# Patient Record
Sex: Male | Born: 1965 | Race: Black or African American | Hispanic: No | Marital: Single | Smoking: Former smoker
Health system: Southern US, Community
[De-identification: ages and names within clinical notes are randomized; demographics above are authoritative.]

## PROBLEM LIST (undated history)

## (undated) DIAGNOSIS — E785 Hyperlipidemia, unspecified: Secondary | ICD-10-CM

## (undated) DIAGNOSIS — R590 Localized enlarged lymph nodes: Secondary | ICD-10-CM

## (undated) DIAGNOSIS — J45909 Unspecified asthma, uncomplicated: Secondary | ICD-10-CM

## (undated) DIAGNOSIS — T7840XA Allergy, unspecified, initial encounter: Secondary | ICD-10-CM

## (undated) DIAGNOSIS — I10 Essential (primary) hypertension: Secondary | ICD-10-CM

## (undated) DIAGNOSIS — I2510 Atherosclerotic heart disease of native coronary artery without angina pectoris: Secondary | ICD-10-CM

## (undated) DIAGNOSIS — I509 Heart failure, unspecified: Secondary | ICD-10-CM

## (undated) HISTORY — DX: Allergy, unspecified, initial encounter: T78.40XA

## (undated) HISTORY — PX: NO PAST SURGERIES: SHX2092

## (undated) HISTORY — DX: Unspecified asthma, uncomplicated: J45.909

---

## 1998-07-16 ENCOUNTER — Emergency Department (HOSPITAL_COMMUNITY): Admission: EM | Admit: 1998-07-16 | Discharge: 1998-07-16 | Payer: Self-pay | Admitting: Emergency Medicine

## 1998-08-21 ENCOUNTER — Emergency Department (HOSPITAL_COMMUNITY): Admission: EM | Admit: 1998-08-21 | Discharge: 1998-08-21 | Payer: Self-pay | Admitting: Emergency Medicine

## 2014-12-30 ENCOUNTER — Ambulatory Visit (INDEPENDENT_AMBULATORY_CARE_PROVIDER_SITE_OTHER): Payer: Self-pay | Admitting: Physician Assistant

## 2014-12-30 VITALS — BP 160/92 | HR 68 | Temp 97.7°F | Resp 16 | Ht 74.0 in | Wt 186.6 lb

## 2014-12-30 DIAGNOSIS — L309 Dermatitis, unspecified: Secondary | ICD-10-CM

## 2014-12-30 DIAGNOSIS — IMO0001 Reserved for inherently not codable concepts without codable children: Secondary | ICD-10-CM

## 2014-12-30 DIAGNOSIS — R030 Elevated blood-pressure reading, without diagnosis of hypertension: Secondary | ICD-10-CM

## 2014-12-30 MED ORDER — TRIAMCINOLONE ACETONIDE 0.1 % EX OINT: 1.0000 "application " | TOPICAL_OINTMENT | Freq: Two times a day (BID) | CUTANEOUS | Status: DC

## 2014-12-30 MED ORDER — PREDNISONE 20 MG PO TABS: ORAL_TABLET | ORAL | Status: DC

## 2014-12-30 NOTE — Progress Notes (Signed)
   01/05/2015 at 9:10 PM  [name] / DOB: [birth year] / MRN: [id number]  The patient  does not have a problem list on file.  SUBJECTIVE  Christian Horton is a 49 y.o. homeless male with a history of eczema presenting for the chief complaint of itching a skin irritation. States he was at a fish fry 3 days ago and noticed his skin was aggravated after.  Since that time he has developed skin irritation throughout his entire body, worse on his legs and ankles. Reports episodes worse than this in the past, and typically gets a cream and pills and this alleviates his symptoms.  He has been using a dermatologist recommended lotion for his symptoms.   He denies a history of HTN.  Denies HA, nausea, vision changes, chest pain, palpitations, and SOB.     He  has a past medical history of Allergy and Asthma.    Medications reviewed and updated by myself where necessary, and exist elsewhere in the encounter.   [name] is allergic to penicillins. He  reports that he has quit smoking. He does not have any smokeless tobacco history on file. He  has no sexual activity history on file. The patient  has no past surgical history on file.  His family history includes Asthma in his mother and sister; Cancer in his father; Stroke in his father.  Review of Systems  Constitutional: Negative for fever and chills.  Respiratory: Negative for cough.   Gastrointestinal: Negative for nausea.  Skin: Positive for itching and rash.  Neurological: Negative for dizziness and headaches.    OBJECTIVE  His  height is 6\' 2"  (1.88 m) and weight is 186 lb 9.6 oz (84.641 kg). His oral temperature is 97.7 F (36.5 C). His blood pressure is 160/92 and his pulse is 68. His respiration is 16 and oxygen saturation is 98%.  The patient's body mass index is 23.95 kg/(m^2).  Physical Exam  Vitals reviewed. Constitutional: He is oriented to person, place, and time. He appears well-developed and well-nourished. No distress.   Cardiovascular: Normal rate and regular rhythm.   Respiratory: Effort normal.  Neurological: He is alert and oriented to person, place, and time.  Skin: Rash noted. He is not diaphoretic.  Generalized xerotic rash.  The ankles exhibit scaling and mild swelling and excoriation without tenderness. There is no exudate.        No results found for this or any previous visit (from the past 24 hour(s)).  ASSESSMENT & PLAN  Linkoln was seen today for eczema and allergic reaction.  Diagnoses and all orders for this visit:  Eczema -     triamcinolone ointment (KENALOG) 0.1 %; Apply 1 application topically 2 (two) times daily. -     predniSONE (DELTASONE) 20 MG tablet; Take 3 PO QAM x3days, 2 PO QAM x3days, 1 PO QAM x3days -     Ambulatory referral to Dermatology  Elevated BP:: Patient without history of HTN. He is amenable to measuring and recording his BP daily and will return the measures to me for review in one week.      The patient was advised to call or come back to clinic if he does not see an improvement in symptoms, or worsens with the above plan.   Deliah Boston, MHS, PA-C [facility] 01/05/2015 9:10 PM

## 2015-01-07 NOTE — Progress Notes (Signed)
  Medical screening examination/treatment/procedure(s) were performed by non-physician practitioner and as supervising physician I was immediately available for consultation/collaboration.     

## 2015-01-21 ENCOUNTER — Other Ambulatory Visit: Payer: Self-pay | Admitting: Physician Assistant

## 2015-01-21 ENCOUNTER — Ambulatory Visit (INDEPENDENT_AMBULATORY_CARE_PROVIDER_SITE_OTHER): Payer: BLUE CROSS/BLUE SHIELD | Admitting: Family Medicine

## 2015-01-21 VITALS — BP 170/90 | HR 67 | Temp 98.4°F | Resp 16 | Ht 74.0 in | Wt 185.0 lb

## 2015-01-21 DIAGNOSIS — I517 Cardiomegaly: Secondary | ICD-10-CM

## 2015-01-21 DIAGNOSIS — R0789 Other chest pain: Secondary | ICD-10-CM

## 2015-01-21 DIAGNOSIS — I10 Essential (primary) hypertension: Secondary | ICD-10-CM

## 2015-01-21 DIAGNOSIS — D539 Nutritional anemia, unspecified: Secondary | ICD-10-CM | POA: Diagnosis not present

## 2015-01-21 LAB — POCT CBC
Granulocyte percent: 51.9 %G (ref 37–80)
HCT, POC: 42.1 % — AB (ref 43.5–53.7)
Hemoglobin: 13.6 g/dL — AB (ref 14.1–18.1)
Lymph, poc: 1.5 (ref 0.6–3.4)
MCH, POC: 25.2 pg — AB (ref 27–31.2)
MCHC: 32.4 g/dL (ref 31.8–35.4)
MCV: 77.8 fL — AB (ref 80–97)
MID (CBC): 0.4 (ref 0–0.9)
MPV: 7.5 fL (ref 0–99.8)
PLATELET COUNT, POC: 170 10*3/uL (ref 142–424)
POC Granulocyte: 2.1 (ref 2–6.9)
POC LYMPH PERCENT: 37.9 %L (ref 10–50)
POC MID %: 10.2 %M (ref 0–12)
RBC: 5.41 M/uL (ref 4.69–6.13)
RDW, POC: 14.9 %
WBC: 4 10*3/uL — AB (ref 4.6–10.2)

## 2015-01-21 LAB — POCT URINALYSIS DIPSTICK
Bilirubin, UA: NEGATIVE
Glucose, UA: NEGATIVE
Ketones, UA: NEGATIVE
Leukocytes, UA: NEGATIVE
Nitrite, UA: NEGATIVE
PH UA: 7
PROTEIN UA: NEGATIVE
RBC UA: NEGATIVE
SPEC GRAV UA: 1.02
UROBILINOGEN UA: 1

## 2015-01-21 LAB — POCT GLYCOSYLATED HEMOGLOBIN (HGB A1C): Hemoglobin A1C: 5.7

## 2015-01-21 MED ORDER — LOSARTAN POTASSIUM-HCTZ 100-12.5 MG PO TABS: 1.0000 | ORAL_TABLET | Freq: Every day | ORAL | Status: DC

## 2015-01-21 NOTE — Progress Notes (Signed)
01/21/2015 at 7:08 PM  Christian Horton / DOB: February 25, 1966 / MRN: [id number]  The patient  does not have a problem list on file.  SUBJECTIVE  Christian Horton is a 49 y.o. well appearing homeless male with a history of eczema, asthma, and ASA allergy presenting for the chief complaint of blood pressure check. He denies HA, nausea, orthopnea, leg and abdominal swelling, DOE and SOB. He has never been on medication for HTN.    He reports repeated episodes of chest pain over the last 2 years which he describes as sharp, left substernal and these episodes last anywhere from 5 to 10 minutes. The pain is made worse by intense physical labor.  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  His most recent episode occurred last week during work and denies any SOB, chest pressure, presyncope and diaphoresis.  He denies leg and abdominal swelling.   He does not want to work up this problem.     He  has a past medical history of Allergy and Asthma.    Medications reviewed and updated by myself where necessary, and exist elsewhere in the encounter.   Christian Horton is allergic to penicillins. He  reports that he has quit smoking. He does not have any smokeless tobacco history on file. He  has no sexual activity history on file. The patient  has no past surgical history on file.  His family history includes Asthma in his mother and sister; Cancer in his father; Stroke in his father.  Review of Systems  Constitutional: Negative for fever and chills.  Respiratory: Negative for shortness of breath.   Cardiovascular: Negative for chest pain.  Gastrointestinal: Negative for nausea and abdominal pain.  Genitourinary: Negative.   Skin: Negative for rash.  Neurological: Negative for dizziness and headaches.    OBJECTIVE  His  height is  (1.88 m) and weight is 185 lb (83.915 kg). His oral temperature is 98.4 F (36.9 C). His blood pressure is 170/90 and his  pulse is 67. His respiration is 16 and oxygen saturation is 98%.  The patient's body mass index is 23.74 kg/(m^2).  Physical Exam  Vitals reviewed. Constitutional: He is oriented to person, place, and time. He appears well-developed. No distress.  Eyes: EOM are normal. Pupils are equal, round, and reactive to light. No scleral icterus.  Neck: Normal range of motion.  Cardiovascular: Regular rhythm, normal heart sounds and intact distal pulses.  Exam reveals no gallop and no friction rub.   No murmur heard. Respiratory: Effort normal and breath sounds normal.  GI: Soft. He exhibits no distension.  Musculoskeletal: Normal range of motion.  Neurological: He is alert and oriented to person, place, and time. No cranial nerve deficit.  Skin: Skin is warm and dry. No rash noted. He is not diaphoretic.  Psychiatric: He has a normal mood and affect.   EKG: Sinus brady with mild left axis deviation and LVH.   Results for orders placed or performed in visit on 01/21/15 (from the past 24 hour(s))  POCT CBC     Status: Abnormal   Collection Time: 01/21/15  6:53 PM  Result Value Ref Range   WBC 4.0 (A) 4.6 - 10.2 K/uL   Lymph, poc 1.5 0.6 - 3.4   POC LYMPH PERCENT 37.9 10 - 50 %L   MID (cbc) 0.4 0 - 0.9   POC MID % 10.2 0 - 12 %M  POC Granulocyte 2.1 2 - 6.9   Granulocyte percent 51.9 37 - 80 %G   RBC 5.41 4.69 - 6.13 M/uL   Hemoglobin 13.6 (A) 14.1 - 18.1 g/dL   HCT, POC 94.4 (A) 96.7 - 53.7 %   MCV 77.8 (A) 80 - 97 fL   MCH, POC 25.2 (A) 27 - 31.2 pg   MCHC 32.4 31.8 - 35.4 g/dL   RDW, POC 59.1 %   Platelet Count, POC 170 142 - 424 K/uL   MPV 7.5 0 - 99.8 fL  POCT glycosylated hemoglobin (Hb A1C)     Status: None   Collection Time: 01/21/15  7:00 PM  Result Value Ref Range   Hemoglobin A1C 5.7     ASSESSMENT & PLAN  Christian Horton was seen today for hypertension.  Diagnoses and all orders for this visit:  Uncontrolled stage 2 hypertension: Patient with most likely chronic untreated  HTN and now with LVH.  Will treat with duel agent Losartan/HCTZ.  He is to RTC in 3 weeks for follow up.  Hopefully his cardiology evaluation will have taken place by that time.  He is not a diabetic and is not a smoker.  At next visit will check HIV, RPR and lipids in order to get him caught up with his screenings.  He is due for a colonoscopy.  -     EKG 12-Lead -     COMPLETE METABOLIC PANEL WITH GFR -     TSH -     POCT CBC -     Cancel: Hemoglobin A1c -     POCT glycosylated hemoglobin (Hb A1C)  Macrocytic anemia -     Iron  Left ventricular hypertrophy by electrocardiogram -     Ambulatory referral to Cardiology  Atypical chest pain -     Ambulatory referral to Cardiology    The patient was advised to call or come back to clinic if he does not see an improvement in symptoms, or worsens with the above plan.   Deliah Boston, MHS, PA-C [facility] 01/21/2015 7:08 PM

## 2015-01-22 LAB — COMPLETE METABOLIC PANEL WITH GFR
ALBUMIN: 3.6 g/dL (ref 3.6–5.1)
ALK PHOS: 53 U/L (ref 40–115)
ALT: 23 U/L (ref 9–46)
AST: 27 U/L (ref 10–35)
BUN: 18 mg/dL (ref 7–25)
CHLORIDE: 104 mmol/L (ref 98–110)
CO2: 26 mmol/L (ref 20–31)
Calcium: 9.2 mg/dL (ref 8.6–10.3)
Creat: 0.92 mg/dL (ref 0.70–1.33)
GFR, Est African American: >89 mL/min (ref >=60)
GLUCOSE: 85 mg/dL (ref 65–99)
POTASSIUM: 3.9 mmol/L (ref 3.5–5.3)
Sodium: 139 mmol/L (ref 135–146)
Total Bilirubin: 0.4 mg/dL (ref 0.2–1.2)
Total Protein: 6.4 g/dL (ref 6.1–8.1)

## 2015-01-22 LAB — TSH: TSH: 1.454 u[IU]/mL (ref 0.350–4.500)

## 2015-01-22 LAB — IRON: Iron: 67 ug/dL (ref 50–180)

## 2015-01-24 NOTE — Progress Notes (Signed)
History and physical examinations reviewed in detail with PA Clark.  EKG reviewed during visit. Agree with assessment and plan. Laconya Clere Paulita Fujita, M.D. [facility] [address] [place], [place]  [address] [phone] phone [phone] fax

## 2015-01-26 ENCOUNTER — Telehealth: Payer: Self-pay | Admitting: Family Medicine

## 2015-01-26 LAB — IRON AND TIBC
%SAT: 21 % (ref 15–60)
Iron: 65 ug/dL (ref 50–180)
TIBC: 308 ug/dL (ref 250–425)
UIBC: 243 ug/dL (ref 125–400)

## 2015-01-26 LAB — FERRITIN: Ferritin: 27 ng/mL (ref 22–322)

## 2015-01-26 NOTE — Telephone Encounter (Signed)
Noted.  Deliah Boston, MS, PA-C   3:22 PM, 01/26/2015

## 2015-01-26 NOTE — Telephone Encounter (Signed)
[facility] states that they couldn't do the reticulocytes test cause it has to be within 48 hours

## 2015-02-18 ENCOUNTER — Ambulatory Visit (INDEPENDENT_AMBULATORY_CARE_PROVIDER_SITE_OTHER): Payer: BLUE CROSS/BLUE SHIELD | Admitting: Cardiology

## 2015-02-18 ENCOUNTER — Encounter: Payer: Self-pay | Admitting: Cardiology

## 2015-02-18 VITALS — BP 134/86 | HR 96 | Ht 74.0 in | Wt 185.0 lb

## 2015-02-18 DIAGNOSIS — I10 Essential (primary) hypertension: Secondary | ICD-10-CM

## 2015-02-18 DIAGNOSIS — R0789 Other chest pain: Secondary | ICD-10-CM

## 2015-02-18 DIAGNOSIS — R9431 Abnormal electrocardiogram [ECG] [EKG]: Secondary | ICD-10-CM | POA: Diagnosis not present

## 2015-02-18 HISTORY — DX: Essential (primary) hypertension: I10

## 2015-02-18 NOTE — Patient Instructions (Signed)
Medication Instructions:  The current medical regimen is effective;  continue present plan and medications.  Testing/Procedures: Your physician has requested that you have an exercise tolerance test. For further information please visit [email or web address] Please also follow instruction sheet, as given.  Follow-Up: Follow up as needed.  Thank you for choosing [facility]!!

## 2015-02-18 NOTE — Progress Notes (Signed)
Cardiology Office Note   Date:  02/18/2015   ID:  Christian Horton, DOB Jan 22, 1966, MRN [id number]  PCP:  Charolett Bumpers, MD  Cardiologist:   Donato Schultz, MD   No chief complaint on file.     History of Present Illness: Christian Horton is a 49 y.o. male who presents for evaluation of abnormal EKG with left ventricular hypertrophy echocardiogram as well as atypical chest pain. EKG personally reviewed from 01/21/15 shows sinus bradycardia left axis deviation with nonspecific ST-T wave changes.  He had an episode of chest discomfort over the last several years described as left-sided substernal a few minutes duration made worse by exertion, labor. Described it as a sharp type of discomfort. Yesterday he had one and he thought he might be having a heart attack. Quit smoking 18 years ago. No early heart disease. Has an aspirin allergy, skin was very itchy he states. Blood pressure has been elevated. This is much improved now with current medication.  Sometimes he has asthma. This type of chest discomfort has been present for over 5 years.      Past Medical History  Diagnosis Date  . Allergy   . Asthma     Past Surgical History  Procedure Laterality Date  . No past surgeries       Current Outpatient Prescriptions  Medication Sig Dispense Refill  . ALBUTEROL IN Inhale into the lungs.    Marland Kitchen losartan-hydrochlorothiazide (HYZAAR) 100-12.5 MG per tablet Take 1 tablet by mouth daily. 30 tablet 3  . triamcinolone ointment (KENALOG) 0.1 % Apply 1 application topically 2 (two) times daily. 30 g 5   No current facility-administered medications for this visit.    Allergies:   Penicillins    Social History:  The patient  reports that he has quit smoking. He does not have any smokeless tobacco history on file.   Family History:  The patient's family history includes Asthma in his mother and sister; Cancer in his father; Stroke in his father.    ROS:  Please see the history of  present illness.   Otherwise, review of systems are positive for snoring, chest pain.   All other systems are reviewed and negative.    PHYSICAL EXAM: VS:  BP 134/86 mmHg  Pulse 96  Ht  (1.88 m)  Wt 185 lb (83.915 kg)  BMI 23.74 kg/m2 , BMI Body mass index is 23.74 kg/(m^2). GEN: Well nourished, well developed, in no acute distress HEENT: normal Neck: no JVD, carotid bruits, or masses Cardiac: RRR; no murmurs, rubs, or gallops,no edema  Respiratory:  clear to auscultation bilaterally, normal work of breathing GI: soft, nontender, nondistended, + BS MS: no deformity or atrophy Skin: warm and dry, no rash Neuro:  Strength and sensation are intact Psych: euthymic mood, full affect   EKG:  01/21/15-sinus bradycardia nonspecific ST-T wave changes with J-point elevation early precordial leads, left ventricular hypertrophy. Personally viewed   Recent Labs: 01/21/2015: ALT 23; BUN 18; Creat 0.92; Hemoglobin 13.6*; Potassium 3.9; Sodium 139; TSH 1.454    Lipid Panel No results found for: CHOL, TRIG, HDL, CHOLHDL, VLDL, LDLCALC, LDLDIRECT    Wt Readings from Last 3 Encounters:  02/18/15 185 lb (83.915 kg)  01/21/15 185 lb (83.915 kg)  12/30/14 186 lb 9.6 oz (84.641 kg)      Other studies Reviewed: Additional studies/ records that were reviewed today include: Prior EKG, lab work, office note. Review of the above records demonstrates: as above   ASSESSMENT AND  PLAN:  1.  Atypical chest pain-sharp intense chest discomfort may be musculoskeletal. However I would like to exclude any high risk features on exercise treadmill test. Former smoker. Age. Hypertension. No early family history of CAD.  2. Essential hypertension-currently agree with drug regimen. Doing a good job.  3. Abnormal EKG-left ventricular hypertrophy-continue treat high blood pressure.   Current medicines are reviewed at length with the patient today.  The patient does not have concerns regarding  medicines.  The following changes have been made:  no change  Labs/ tests ordered today include: ETT  No orders of the defined types were placed in this encounter.     Disposition:   I will follow-up with results of exercise treadmill test. Signed, Donato Schultz, MD  02/18/2015 2:48 PM    [facility] [address] [place], [place], [place]  [address] Phone: [phone]; Fax: [phone]

## 2015-03-05 ENCOUNTER — Ambulatory Visit (INDEPENDENT_AMBULATORY_CARE_PROVIDER_SITE_OTHER): Payer: Worker's Compensation | Admitting: Emergency Medicine

## 2015-03-05 VITALS — BP 130/84 | HR 78 | Temp 98.2°F | Ht 73.25 in | Wt 191.0 lb

## 2015-03-05 DIAGNOSIS — S50311A Abrasion of right elbow, initial encounter: Secondary | ICD-10-CM

## 2015-03-05 DIAGNOSIS — T07XXXA Unspecified multiple injuries, initial encounter: Secondary | ICD-10-CM

## 2015-03-05 NOTE — Patient Instructions (Signed)
Abrasion An abrasion is a cut or scrape on the outer surface of your skin. An abrasion does not extend through all of the layers of your skin. It is important to care for your abrasion properly to prevent infection. CAUSES Most abrasions are caused by falling on or gliding across the ground or another surface. When your skin rubs on something, the outer and inner layer of skin rubs off.  SYMPTOMS A cut or scrape is the main symptom of this condition. The scrape may be bleeding, or it may appear red or pink. If there was an associated fall, there may be an underlying bruise. DIAGNOSIS An abrasion is diagnosed with a physical exam. TREATMENT Treatment for this condition depends on how large and deep the abrasion is. Usually, your abrasion will be cleaned with water and mild soap. This removes any dirt or debris that may be stuck. An antibiotic ointment may be applied to the abrasion to help prevent infection. A bandage (dressing) may be placed on the abrasion to keep it clean. You may also need a tetanus shot. HOME CARE INSTRUCTIONS Medicines  Take or apply medicines only as directed by your health care provider.  If you were prescribed an antibiotic ointment, finish all of it even if you start to feel better. Wound Care  Clean the wound with mild soap and water 2-3 times per day or as directed by your health care provider. Pat your wound dry with a clean towel. Do not rub it.  There are many different ways to close and cover a wound. Follow instructions from your health care provider about:  Wound care.  Dressing changes and removal.  Check your wound every day for signs of infection. Watch for:  Redness, swelling, or pain.  Fluid, blood, or pus. General Instructions  Keep the dressing dry as directed by your health care provider. Do not take baths, swim, use a hot tub, or do anything that would put your wound underwater until your health care provider approves.  If there is  swelling, raise (elevate) the injured area above the level of your heart while you are sitting or lying down.  Keep all follow-up visits as directed by your health care provider. This is important. SEEK MEDICAL CARE IF:  You received a tetanus shot and you have swelling, severe pain, redness, or bleeding at the injection site.  Your pain is not controlled with medicine.  You have increased redness, swelling, or pain at the site of your wound. SEEK IMMEDIATE MEDICAL CARE IF:  You have a red streak going away from your wound.  You have a fever.  You have fluid, blood, or pus coming from your wound.  You notice a bad smell coming from your wound or your dressing.   This information is not intended to replace advice given to you by your health care provider. Make sure you discuss any questions you have with your health care provider.   Document Released: 02/15/2005 Document Revised: 01/27/2015 Document Reviewed: 05/06/2014 Elsevier Interactive Patient Education 2016 [employer]  

## 2015-03-05 NOTE — Progress Notes (Signed)
**Note Christian-Identified via Obfuscation**    Subjective:  Patient ID: Christian Horton, male    DOB: 1966-05-19  Age: 49 y.o. 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 He denies right elbow and abrasion on his right thigh. Elevating and has a full range of motion on both his thigh and knee hip and elbow. He does not have any particular pain. He is current on tetanus.  History  Past medical family and social history noncontributory  Review of Systems   Review of systems is noncontributory  Objective:  BP 130/84 mmHg  Pulse 78  Temp(Src) 98.2 F (36.8 C) (Oral)  Ht 6' 1.25" (1.861 m)  Wt 191 lb (86.637 kg)  BMI 25.02 kg/m2  SpO2 98%  Physical Exam  Constitutional: He is oriented to person, place, and time. He appears well-developed and well-nourished. No distress.  HENT:  Head: Normocephalic and atraumatic.  Right Ear: External ear normal.  Left Ear: External ear normal.  Nose: Nose normal.  Eyes: Conjunctivae and EOM are normal. Pupils are equal, round, and reactive to light. No scleral icterus.  Neck: Normal range of motion. Neck supple. No tracheal deviation present.  Cardiovascular: Normal rate, regular rhythm and normal heart sounds.   Pulmonary/Chest: Effort normal. No respiratory distress. He has no wheezes. He has no rales.  Abdominal: He exhibits no mass. There is no tenderness. There is no rebound and no guarding.  Musculoskeletal: He exhibits no edema.       Right upper leg: He exhibits tenderness. He exhibits no bony tenderness.  Lymphadenopathy:    He has no cervical adenopathy.  Neurological: He is alert and oriented to person, place, and time. Coordination normal.  Skin: Skin is warm and dry. Abrasion (right elbow) noted. No rash noted.  Psychiatric: He has a normal mood and affect. His behavior is normal.      Assessment & Plan:   Christian Horton was seen today for hip injury and elbow  injury.  Diagnoses and all orders for this visit:  Abrasion, multiple sites  I am having Christian Horton maintain his ALBUTEROL IN, triamcinolone ointment, and losartan-hydrochlorothiazide.  No orders of the defined types were placed in this encounter.    Appropriate red flag conditions were discussed with the patient as well as actions that should be taken.  Patient expressed his understanding.  Follow-up: Return in 1 week (on 03/12/2015), or if symptoms worsen or fail to improve.  Carmelina Dane, MD

## 2015-03-18 ENCOUNTER — Encounter: Payer: BLUE CROSS/BLUE SHIELD | Admitting: Physician Assistant

## 2015-03-18 ENCOUNTER — Ambulatory Visit (INDEPENDENT_AMBULATORY_CARE_PROVIDER_SITE_OTHER): Payer: BLUE CROSS/BLUE SHIELD

## 2015-03-18 DIAGNOSIS — R0789 Other chest pain: Secondary | ICD-10-CM | POA: Diagnosis not present

## 2015-03-18 LAB — EXERCISE TOLERANCE TEST
CHL CUP MPHR: 170 {beats}/min
CSEPEW: 10.7 METS
CSEPHR: 94 %
CSEPPHR: 160 {beats}/min
Exercise duration (min): 9 min
Exercise duration (sec): 30 s
RPE: 15
Rest HR: 81 {beats}/min

## 2015-04-09 ENCOUNTER — Telehealth: Payer: Self-pay

## 2015-04-09 ENCOUNTER — Other Ambulatory Visit: Payer: Self-pay | Admitting: Physician Assistant

## 2015-04-09 DIAGNOSIS — Z760 Encounter for issue of repeat prescription: Secondary | ICD-10-CM

## 2015-04-09 MED ORDER — ALBUTEROL SULFATE HFA 108 (90 BASE) MCG/ACT IN AERS: 2.0000 | INHALATION_SPRAY | Freq: Four times a day (QID) | RESPIRATORY_TRACT | Status: DC | PRN

## 2015-04-09 NOTE — Telephone Encounter (Signed)
Pt would like a script for his ALBUTEROL IN [[id number]]. He stated he spoke with Deliah Boston about this issue. Please advise at [phone]

## 2015-04-09 NOTE — Telephone Encounter (Signed)
I will send to his pharmacy. Please call and advise we see him back sometime soon.  Deliah Boston, MS, PA-C 5:49 PM, 04/09/2015

## 2015-05-29 ENCOUNTER — Other Ambulatory Visit: Payer: Self-pay | Admitting: Physician Assistant

## 2015-06-01 ENCOUNTER — Telehealth: Payer: Self-pay

## 2015-06-01 DIAGNOSIS — L309 Dermatitis, unspecified: Secondary | ICD-10-CM

## 2015-06-01 MED ORDER — TRIAMCINOLONE ACETONIDE 0.1 % EX OINT: 1.0000 "application " | TOPICAL_OINTMENT | Freq: Two times a day (BID) | CUTANEOUS | Status: DC

## 2015-06-01 NOTE — Telephone Encounter (Signed)
Patient would like a refill for proair inhaler and triamcinolone cream [facility] on [address].

## 2015-06-01 NOTE — Telephone Encounter (Signed)
Rx sent in

## 2015-06-27 ENCOUNTER — Other Ambulatory Visit: Payer: Self-pay | Admitting: Physician Assistant

## 2015-06-28 NOTE — Telephone Encounter (Signed)
Patient needs a follow up.  Last refill on medication

## 2015-07-06 ENCOUNTER — Encounter (HOSPITAL_COMMUNITY): Payer: Self-pay

## 2015-07-06 ENCOUNTER — Emergency Department (HOSPITAL_COMMUNITY): Payer: BLUE CROSS/BLUE SHIELD

## 2015-07-06 ENCOUNTER — Emergency Department (HOSPITAL_COMMUNITY)
Admission: EM | Admit: 2015-07-06 | Discharge: 2015-07-06 | Disposition: A | Discharge status: Home / Self Care | Payer: BLUE CROSS/BLUE SHIELD | Attending: Physician Assistant | Admitting: Physician Assistant

## 2015-07-06 DIAGNOSIS — J45909 Unspecified asthma, uncomplicated: Secondary | ICD-10-CM | POA: Insufficient documentation

## 2015-07-06 DIAGNOSIS — J111 Influenza due to unidentified influenza virus with other respiratory manifestations: Secondary | ICD-10-CM

## 2015-07-06 DIAGNOSIS — Z880 Allergy status to penicillin: Secondary | ICD-10-CM | POA: Insufficient documentation

## 2015-07-06 DIAGNOSIS — R000 Tachycardia, unspecified: Secondary | ICD-10-CM | POA: Insufficient documentation

## 2015-07-06 DIAGNOSIS — Z87891 Personal history of nicotine dependence: Secondary | ICD-10-CM | POA: Insufficient documentation

## 2015-07-06 DIAGNOSIS — Z79899 Other long term (current) drug therapy: Secondary | ICD-10-CM | POA: Insufficient documentation

## 2015-07-06 DIAGNOSIS — Z7952 Long term (current) use of systemic steroids: Secondary | ICD-10-CM | POA: Insufficient documentation

## 2015-07-06 LAB — COMPREHENSIVE METABOLIC PANEL
ALK PHOS: 67 U/L (ref 38–126)
ALT: 29 U/L (ref 17–63)
ANION GAP: 11 (ref 5–15)
AST: 55 U/L — ABNORMAL HIGH (ref 15–41)
Albumin: 3.7 g/dL (ref 3.5–5.0)
BILIRUBIN TOTAL: 0.4 mg/dL (ref 0.3–1.2)
BUN: 16 mg/dL (ref 6–20)
CALCIUM: 8.9 mg/dL (ref 8.9–10.3)
CO2: 26 mmol/L (ref 22–32)
Chloride: 98 mmol/L — ABNORMAL LOW (ref 101–111)
Creatinine, Ser: 1.41 mg/dL — ABNORMAL HIGH (ref 0.61–1.24)
GFR calc non Af Amer: 56 mL/min — ABNORMAL LOW (ref >60)
Glucose, Bld: 86 mg/dL (ref 65–99)
Potassium: 3.7 mmol/L (ref 3.5–5.1)
Sodium: 135 mmol/L (ref 135–145)
TOTAL PROTEIN: 7.7 g/dL (ref 6.5–8.1)

## 2015-07-06 LAB — CBC
HCT: 43.9 % (ref 39.0–52.0)
HEMOGLOBIN: 14.3 g/dL (ref 13.0–17.0)
MCH: 25.1 pg — ABNORMAL LOW (ref 26.0–34.0)
MCHC: 32.6 g/dL (ref 30.0–36.0)
MCV: 77 fL — ABNORMAL LOW (ref 78.0–100.0)
Platelets: 233 10*3/uL (ref 150–400)
RBC: 5.7 MIL/uL (ref 4.22–5.81)
RDW: 14.2 % (ref 11.5–15.5)
WBC: 3.1 10*3/uL — AB (ref 4.0–10.5)

## 2015-07-06 MED ORDER — ACETAMINOPHEN 325 MG PO TABS
650.0000 mg | ORAL_TABLET | Freq: Once | ORAL | Status: AC
  Administered 2015-07-06: 650 mg via ORAL
  Filled 2015-07-06: qty 2

## 2015-07-06 MED ORDER — ALBUTEROL SULFATE HFA 108 (90 BASE) MCG/ACT IN AERS
2.0000 | INHALATION_SPRAY | Freq: Once | RESPIRATORY_TRACT | Status: AC
  Administered 2015-07-06: 2 via RESPIRATORY_TRACT
  Filled 2015-07-06: qty 6.7

## 2015-07-06 MED ORDER — ONDANSETRON HCL 4 MG PO TABS
4.0000 mg | ORAL_TABLET | Freq: Once | ORAL | Status: AC
  Administered 2015-07-06: 4 mg via ORAL
  Filled 2015-07-06: qty 1

## 2015-07-06 MED ORDER — ACETAMINOPHEN 500 MG PO TABS: 500.0000 mg | ORAL_TABLET | Freq: Four times a day (QID) | ORAL | Status: DC | PRN

## 2015-07-06 MED ORDER — ONDANSETRON HCL 4 MG PO TABS: 4.0000 mg | ORAL_TABLET | Freq: Three times a day (TID) | ORAL | Status: DC | PRN

## 2015-07-06 NOTE — Discharge Instructions (Signed)

## 2015-07-06 NOTE — ED Provider Notes (Signed)
CSN: [id number]     Arrival date & time 07/06/15  7673 History   First MD Initiated Contact with Patient 07/06/15 1501     Chief Complaint  Patient presents with  . Chest Congestion   . Chills  . Emesis  . Diarrhea     (Consider location/radiation/quality/duration/timing/severity/associated sxs/prior Treatment) HPI  Patient is a pleasant 50 year old gentleman presenting today with multiple complaints. Patient says that since last 4-5 days he's been having temperature, chills, nausea vomiting diarrhea, mild cough. He still able to take by mouth without issue. He has not tried any medication to help. Patient is homeless and doesn't feel he can afford any over-the-counter or prescription medication to this time.  Patient history of asthma. Has not been using inhaler.  Of note, it  is flu season right now.   Past Medical History  Diagnosis Date  . Allergy   . Asthma    Past Surgical History  Procedure Laterality Date  . No past surgeries     Family History  Problem Relation Age of Onset  . Asthma Mother   . Stroke Father   . Cancer Father   . Asthma Sister    Social History  Substance Use Topics  . Smoking status: Former Games developer  . Smokeless tobacco: Never Used  . Alcohol Use: No    Review of Systems  Constitutional: Positive for fever and chills. Negative for activity change.  HENT: Positive for congestion.   Respiratory: Positive for cough. Negative for shortness of breath.   Cardiovascular: Negative for chest pain.  Gastrointestinal: Positive for nausea, vomiting and diarrhea. Negative for abdominal pain.  Musculoskeletal: Negative for back pain.  Psychiatric/Behavioral: Negative for agitation.      Allergies  Fish allergy; Peanut-containing drug products; and Penicillins  Home Medications   Prior to Admission medications   Medication Sig Start Date End Date Taking? Authorizing Provider  ALBUTEROL IN Inhale into the lungs.   Yes Historical Provider, MD   losartan-hydrochlorothiazide (HYZAAR) 100-12.5 MG per tablet Take 1 tablet by mouth daily. 01/21/15  Yes Ofilia Neas, PA-C  PROAIR HFA 108 (90 Base) MCG/ACT inhaler INHALE 2 PUFFS INTO THE LUNGS EVERY 6 HOURS AS NEEDED FOR WHEEZING OR SHORTNESS OF BREATH 06/28/15  Yes Ofilia Neas, PA-C  triamcinolone ointment (KENALOG) 0.1 % Apply 1 application topically 2 (two) times daily. 06/01/15  Yes Carmelina Dane, MD   BP 138/93 mmHg  Pulse 102  Temp(Src) 99 F (37.2 C) (Oral)  Resp 16  SpO2 98% Physical Exam  Constitutional: He is oriented to person, place, and time. He appears well-nourished.  HENT:  Head: Normocephalic.  Mouth/Throat: Oropharynx is clear and moist.  Eyes: Conjunctivae are normal.  Neck: No tracheal deviation present.  Cardiovascular:  Mild tachycardia  Pulmonary/Chest: Effort normal. No stridor. No respiratory distress. He has no wheezes.  Abdominal: Soft. There is no tenderness. There is no guarding.  Musculoskeletal: Normal range of motion. He exhibits no edema.  Neurological: He is oriented to person, place, and time. No cranial nerve deficit.  Skin: Skin is warm and dry. No rash noted. He is not diaphoretic.  Psychiatric: He has a normal mood and affect. His behavior is normal.  Nursing note and vitals reviewed.   ED Course  Procedures (including critical care time) Labs Review Labs Reviewed  COMPREHENSIVE METABOLIC PANEL - Abnormal; Notable for the following:    Chloride 98 (*)    Creatinine, Ser 1.41 (*)    AST 55 (*)  GFR calc non Af Amer 56 (*)    All other components within normal limits  CBC - Abnormal; Notable for the following:    WBC 3.1 (*)    MCV 77.0 (*)    MCH 25.1 (*)    All other components within normal limits  URINALYSIS, ROUTINE W REFLEX MICROSCOPIC (NOT AT [facility])    Imaging Review Dg Chest 2 View  07/06/2015  CLINICAL DATA:  Increased shortness of breath since Sunday with chest tightness. EXAM: CHEST  2 VIEW COMPARISON:  None.  FINDINGS: Hyperinflation of the lungs. Heart and mediastinal contours are within normal limits. No focal opacities or effusions. No acute bony abnormality. IMPRESSION: No active cardiopulmonary disease. Electronically Signed   By: Charlett Nose M.D.   On: 07/06/2015 10:15   I have personally reviewed and evaluated these images and lab results as part of my medical decision-making.   EKG Interpretation None      MDM   Final diagnoses:  None   Patient is a 50 year old male presenting with multiple symptoms over last couple days. Patient refusing IV right now. He is also refusing any antibiotics. We will give Tylenol, Zofran. We'll give him albuterol so he is able to take the inhalor home (with out having to byu one) . Patient does not want IV or further testing at this time. I believe that this presents a viral illness. Patient has a AKI and I recommended IV fluids the patient refusing this time. I believe the patient is a sound mind to make this decision. Patient's taking by mouth normally and has no abdominal tenderness.    Tomeeka Plaugher Randall An, MD 07/06/15 [phone]

## 2015-07-06 NOTE — ED Notes (Signed)
Pt c/o nasal/chest congestion, cough, chills, emesis and diarrhea x 2 days.  Denies pain.  Pt has not taken anything for symptoms.  Hx of asthma.

## 2015-07-06 NOTE — ED Notes (Signed)
Patient has urinal at bedside.  

## 2015-07-06 NOTE — ED Notes (Signed)
Patient refused IV. 

## 2015-09-27 ENCOUNTER — Telehealth: Payer: Self-pay

## 2015-09-27 NOTE — Telephone Encounter (Signed)
CLARK - Pt said his inhaler quit working.  He still has over 70 puffs left on it, but just cant get anything out of it.  He had to be out of work today due to breathing issues.  Can we prescribe another one?  Will the pharmacy give him another one since this one is faulty? Please call asap [phone]

## 2015-09-27 NOTE — Telephone Encounter (Signed)
Advised pt to take it up to the pharmacy where he rcieved it from to see if they can replace it with another one. Pt states he will and if not he wil come back in to be seen.

## 2015-10-14 ENCOUNTER — Ambulatory Visit (INDEPENDENT_AMBULATORY_CARE_PROVIDER_SITE_OTHER): Payer: Self-pay | Admitting: Family Medicine

## 2015-10-14 VITALS — BP 130/80 | HR 62 | Temp 97.5°F | Resp 18 | Ht 73.25 in | Wt 172.6 lb

## 2015-10-14 DIAGNOSIS — L309 Dermatitis, unspecified: Secondary | ICD-10-CM

## 2015-10-14 DIAGNOSIS — J309 Allergic rhinitis, unspecified: Secondary | ICD-10-CM

## 2015-10-14 DIAGNOSIS — J4530 Mild persistent asthma, uncomplicated: Secondary | ICD-10-CM

## 2015-10-14 MED ORDER — ALBUTEROL SULFATE HFA 108 (90 BASE) MCG/ACT IN AERS: 2.0000 | INHALATION_SPRAY | RESPIRATORY_TRACT | Status: DC | PRN

## 2015-10-14 MED ORDER — TRIAMCINOLONE ACETONIDE 0.1 % EX OINT: 1.0000 "application " | TOPICAL_OINTMENT | Freq: Two times a day (BID) | CUTANEOUS | Status: DC

## 2015-10-14 NOTE — Patient Instructions (Addendum)
Please take an over the counter long acting antihistamine like Zyrtec, Claritin or Allegra (generic is fine) Please mix your triamcinolone cream with Eucerin cream (generic is fine) and apply twice a day for a maximum of 7-10 days  Asthma, Adult Asthma is a recurring condition in which the airways tighten and narrow. Asthma can make it difficult to breathe. It can cause coughing, wheezing, and shortness of breath. Asthma episodes, also called asthma attacks, range from minor to life-threatening. Asthma cannot be cured, but medicines and lifestyle changes can help control it. CAUSES Asthma is believed to be caused by inherited (genetic) and environmental factors, but its exact cause is unknown. Asthma may be triggered by allergens, lung infections, or irritants in the air. Asthma triggers are different for each person. Common triggers include:   Animal dander.  Dust mites.  Cockroaches.  Pollen from trees or grass.  Mold.  Smoke.  Air pollutants such as dust, household cleaners, hair sprays, aerosol sprays, paint fumes, strong chemicals, or strong odors.  Cold air, weather changes, and winds (which increase molds and pollens in the air).  Strong emotional expressions such as crying or laughing hard.  Stress.  Certain medicines (such as aspirin) or types of drugs (such as beta-blockers).  Sulfites in foods and drinks. Foods and drinks that may contain sulfites include dried fruit, potato chips, and sparkling grape juice.  Infections or inflammatory conditions such as the flu, a cold, or an inflammation of the nasal membranes (rhinitis).  Gastroesophageal reflux disease (GERD).  Exercise or strenuous activity. SYMPTOMS Symptoms may occur immediately after asthma is triggered or many hours later. Symptoms include:  Wheezing.  Excessive nighttime or early morning coughing.  Frequent or severe coughing with a common cold.  Chest tightness.  Shortness of breath. DIAGNOSIS   The diagnosis of asthma is made by a review of your medical history and a physical exam. Tests may also be performed. These may include:  Lung function studies. These tests show how much air you breathe in and out.  Allergy tests.  Imaging tests such as X-rays. TREATMENT  Asthma cannot be cured, but it can usually be controlled. Treatment involves identifying and avoiding your asthma triggers. It also involves medicines. There are 2 classes of medicine used for asthma treatment:   Controller medicines. These prevent asthma symptoms from occurring. They are usually taken every day.  Reliever or rescue medicines. These quickly relieve asthma symptoms. They are used as needed and provide short-term relief. Your health care provider will help you create an asthma action plan. An asthma action plan is a written plan for managing and treating your asthma attacks. It includes a list of your asthma triggers and how they may be avoided. It also includes information on when medicines should be taken and when their dosage should be changed. An action plan may also involve the use of a device called a peak flow meter. A peak flow meter measures how well the lungs are working. It helps you monitor your condition. HOME CARE INSTRUCTIONS   Take medicines only as directed by your health care provider. Speak with your health care provider if you have questions about how or when to take the medicines.  Use a peak flow meter as directed by your health care provider. Record and keep track of readings.  Understand and use the action plan to help minimize or stop an asthma attack without needing to seek medical care.  Control your home environment in the following ways to  help prevent asthma attacks:  Do not smoke. Avoid being exposed to secondhand smoke.  Change your heating and air conditioning filter regularly.  Limit your use of fireplaces and wood stoves.  Get rid of pests (such as roaches and mice)  and their droppings.  Throw away plants if you see mold on them.  Clean your floors and dust regularly. Use unscented cleaning products.  Try to have someone else vacuum for you regularly. Stay out of rooms while they are being vacuumed and for a short while afterward. If you vacuum, use a dust mask from a hardware store, a double-layered or microfilter vacuum cleaner bag, or a vacuum cleaner with a HEPA filter.  Replace carpet with wood, tile, or vinyl flooring. Carpet can trap dander and dust.  Use allergy-proof pillows, mattress covers, and box spring covers.  Wash bed sheets and blankets every week in hot water and dry them in a dryer.  Use blankets that are made of polyester or cotton.  Clean bathrooms and kitchens with bleach. If possible, have someone repaint the walls in these rooms with mold-resistant paint. Keep out of the rooms that are being cleaned and painted.  Wash hands frequently. SEEK MEDICAL CARE IF:   You have wheezing, shortness of breath, or a cough even if taking medicine to prevent attacks.  The colored mucus you cough up (sputum) is thicker than usual.  Your sputum changes from clear or white to yellow, green, gray, or bloody.  You have any problems that may be related to the medicines you are taking (such as a rash, itching, swelling, or trouble breathing).  You are using a reliever medicine more than 2-3 times per week.  Your peak flow is still at 50-79% of your personal best after following your action plan for 1 hour.  You have a fever. SEEK IMMEDIATE MEDICAL CARE IF:   You seem to be getting worse and are unresponsive to treatment during an asthma attack.  You are short of breath even at rest.  You get short of breath when doing very little physical activity.  You have difficulty eating, drinking, or talking due to asthma symptoms.  You develop chest pain.  You develop a fast heartbeat.  You have a bluish color to your lips or  fingernails.  You are light-headed, dizzy, or faint.  Your peak flow is less than 50% of your personal best.   This information is not intended to replace advice given to you by your health care provider. Make sure you discuss any questions you have with your health care provider.   Document Released: 05/08/2005 Document Revised: 01/27/2015 Document Reviewed: 12/05/2012 Elsevier Interactive Patient Education 2016 [employer].     IF you received an x-ray today, you will receive an invoice from [facility]. Please contact [facility] at [phone] with questions or concerns regarding your invoice.   IF you received labwork today, you will receive an invoice from [employer]. Please contact [facility] at [phone] with questions or concerns regarding your invoice.   Our billing staff will not be able to assist you with questions regarding bills from these companies.  You will be contacted with the lab results as soon as they are available. The fastest way to get your results is to activate your My Chart account. Instructions are located on the last page of this paperwork. If you have not heard from [place] regarding the results in 2 weeks, please contact this office.

## 2015-10-14 NOTE — Progress Notes (Signed)
   Subjective:    Patient ID: Christian Horton, male    DOB: [birth year], 50 y.o.   MRN: [id number]  HPI This is a 50 yo male who presents today with rash on his "joint areas, since I was born." Has been diagnosed with eczema in the past. Has used triamcinolone cream in the past. He has a history of seasonal allergies and does not take any medicine. Has asthma and requests refill of albuterol. He uses twice a day for wheezing and chest tightness.   He has been working Holiday representative and doing a Runner, broadcasting/film/video work, he states this has helped him lose weight.   Past Medical History  Diagnosis Date  . Allergy   . Asthma    Past Surgical History  Procedure Laterality Date  . No past surgeries     Family History  Problem Relation Age of Onset  . Asthma Mother   . Stroke Father   . Cancer Father   . Asthma Sister    Social History  Substance Use Topics  . Smoking status: Former Games developer  . Smokeless tobacco: Never Used  . Alcohol Use: No      Review of Systems Per HPI    Objective:   Physical Exam  Constitutional: He appears well-developed and well-nourished. No distress.  Cardiovascular: Normal rate, regular rhythm and normal heart sounds.   Pulmonary/Chest: Effort normal and breath sounds normal.  Musculoskeletal: Normal range of motion.  Skin: Skin is warm and dry. He is not diaphoretic.  Hands with widespread dry, flaky skin. Also on legs. Back with very dry skin.   Psychiatric: He has a normal mood and affect. His behavior is normal. Judgment and thought content normal.  Vitals reviewed.     BP 130/80 mmHg  Pulse 62  Temp(Src) 97.5 F (36.4 C) (Oral)  Resp 18  Ht 6' 1.25" (1.861 m)  Wt 172 lb 9.6 oz (78.291 kg)  BMI 22.61 kg/m2  SpO2 100% Wt Readings from Last 3 Encounters:  10/14/15 172 lb 9.6 oz (78.291 kg)  03/05/15 191 lb (86.637 kg)  02/18/15 185 lb (83.915 kg)       Assessment & Plan:  1. Allergic rhinitis, unspecified allergic rhinitis type -  encouraged him to take a daily antihistamine to see if it helps symptoms  2. Eczema - he is taking very hot showers, discussed effect this has on drying out skin and encouraged him to take shorter warm showers and apply Eucerin type cream mixed with triamcinolone to damp skin - triamcinolone ointment (KENALOG) 0.1 %; Apply 1 application topically 2 (two) times daily.  Dispense: 80 g; Refill: 0  3. Asthma, mild persistent, uncomplicated - discussed risks with untreated asthma and he is to return if symptoms don't improve with daily antihistamine to start inhaled corticosteroid. Encouraged him to continue wearing mask when working - albuterol (PROVENTIL HFA;VENTOLIN HFA) 108 (90 Base) MCG/ACT inhaler; Inhale 2 puffs into the lungs every 4 (four) hours as needed for wheezing or shortness of breath (cough, shortness of breath or wheezing.).  Dispense: 1 Inhaler; Refill: 1   Olean Ree, FNP-BC  [facility]  10/16/2015 5:30 PM

## 2016-01-27 ENCOUNTER — Encounter (HOSPITAL_COMMUNITY): Payer: Self-pay | Admitting: Emergency Medicine

## 2016-01-27 DIAGNOSIS — Z79899 Other long term (current) drug therapy: Secondary | ICD-10-CM | POA: Insufficient documentation

## 2016-01-27 DIAGNOSIS — J45901 Unspecified asthma with (acute) exacerbation: Secondary | ICD-10-CM | POA: Insufficient documentation

## 2016-01-27 DIAGNOSIS — Z87891 Personal history of nicotine dependence: Secondary | ICD-10-CM | POA: Insufficient documentation

## 2016-01-27 DIAGNOSIS — I10 Essential (primary) hypertension: Secondary | ICD-10-CM | POA: Insufficient documentation

## 2016-01-27 DIAGNOSIS — Z91010 Allergy to peanuts: Secondary | ICD-10-CM | POA: Insufficient documentation

## 2016-01-27 MED ORDER — ALBUTEROL SULFATE (2.5 MG/3ML) 0.083% IN NEBU
INHALATION_SOLUTION | RESPIRATORY_TRACT | Status: AC
  Filled 2016-01-27: qty 6

## 2016-01-27 MED ORDER — ALBUTEROL SULFATE (2.5 MG/3ML) 0.083% IN NEBU
5.0000 mg | INHALATION_SOLUTION | Freq: Once | RESPIRATORY_TRACT | Status: AC
  Administered 2016-01-27: 5 mg via RESPIRATORY_TRACT

## 2016-01-27 NOTE — ED Triage Notes (Signed)
Pt. reports asthma attack this evening with wheezing and productive cough , denies fever or chills , pt. Stated he ran out of his inhaler.

## 2016-01-28 ENCOUNTER — Emergency Department (HOSPITAL_COMMUNITY)
Admission: EM | Admit: 2016-01-28 | Discharge: 2016-01-28 | Disposition: A | Discharge status: Home / Self Care | Payer: BLUE CROSS/BLUE SHIELD | Attending: Emergency Medicine | Admitting: Emergency Medicine

## 2016-01-28 ENCOUNTER — Emergency Department (HOSPITAL_COMMUNITY): Payer: BLUE CROSS/BLUE SHIELD

## 2016-01-28 DIAGNOSIS — J45901 Unspecified asthma with (acute) exacerbation: Secondary | ICD-10-CM

## 2016-01-28 MED ORDER — ALBUTEROL SULFATE HFA 108 (90 BASE) MCG/ACT IN AERS
1.0000 | INHALATION_SPRAY | Freq: Once | RESPIRATORY_TRACT | Status: AC
  Administered 2016-01-28: 1 via RESPIRATORY_TRACT
  Filled 2016-01-28: qty 6.7

## 2016-01-28 MED ORDER — AZITHROMYCIN 250 MG PO TABS
500.0000 mg | ORAL_TABLET | Freq: Once | ORAL | Status: AC
  Administered 2016-01-28: 500 mg via ORAL
  Filled 2016-01-28: qty 2

## 2016-01-28 MED ORDER — PREDNISONE 10 MG PO TABS: 20.0000 mg | ORAL_TABLET | Freq: Every day | ORAL | 0 refills | Status: DC

## 2016-01-28 MED ORDER — ALBUTEROL SULFATE (2.5 MG/3ML) 0.083% IN NEBU
2.5000 mg | INHALATION_SOLUTION | Freq: Once | RESPIRATORY_TRACT | Status: AC
  Administered 2016-01-28: 2.5 mg via RESPIRATORY_TRACT
  Filled 2016-01-28: qty 3

## 2016-01-28 MED ORDER — IPRATROPIUM BROMIDE 0.02 % IN SOLN
0.5000 mg | Freq: Once | RESPIRATORY_TRACT | Status: AC
  Administered 2016-01-28: 0.5 mg via RESPIRATORY_TRACT
  Filled 2016-01-28: qty 2.5

## 2016-01-28 MED ORDER — PREDNISONE 20 MG PO TABS
60.0000 mg | ORAL_TABLET | Freq: Once | ORAL | Status: AC
  Administered 2016-01-28: 60 mg via ORAL
  Filled 2016-01-28: qty 3

## 2016-01-28 MED ORDER — AZITHROMYCIN 250 MG PO TABS: 250.0000 mg | ORAL_TABLET | Freq: Every day | ORAL | 0 refills | Status: DC

## 2016-01-28 NOTE — ED Notes (Signed)
Pt refused chest x-ray

## 2016-01-28 NOTE — ED Provider Notes (Signed)
[facility]-EMERGENCY DEPT Provider Note   CSN: [id number] Arrival date & time: 01/27/16  2311     History   Chief Complaint Chief Complaint  Patient presents with  . Asthma    HPI Christian Horton is a 50 y.o. male.  HPI   Patient has pMH of allergies and asthma. He comes to the ER with complaints of cough and asthma exacerbation. He typically uses his albuterol inhaler daily and ran out and has been unable to use it for more than 24 hours. He also reports wheezing, coughing and SOB for the past few days. He has not had fevers, LE swelling or CP. No N/V/D, syncope or lethargy. He is able to speak in full sentences.   Past Medical History:  Diagnosis Date  . Allergy   . Asthma     Patient Active Problem List   Diagnosis Date Noted  . Atypical chest pain 02/18/2015  . Essential hypertension 02/18/2015  . Abnormal EKG 02/18/2015    Past Surgical History:  Procedure Laterality Date  . NO PAST SURGERIES         Home Medications    Prior to Admission medications   Medication Sig Start Date End Date Taking? Authorizing Provider  albuterol (PROVENTIL HFA;VENTOLIN HFA) 108 (90 Base) MCG/ACT inhaler Inhale 2 puffs into the lungs every 4 (four) hours as needed for wheezing or shortness of breath (cough, shortness of breath or wheezing.). Patient not taking: Reported on 01/28/2016 10/14/15   Emi Belfasteborah B Gessner, FNP  azithromycin (ZITHROMAX) 250 MG tablet Take 1 tablet (250 mg total) by mouth daily. Take first 2 tablets together, then 1 every day until finished. 01/28/16   America Sandall Neva SeatGreene, PA-C  predniSONE (DELTASONE) 10 MG tablet Take 2 tablets (20 mg total) by mouth daily. 01/28/16   Marlon Peliffany Zachary Nole, PA-C  triamcinolone ointment (KENALOG) 0.1 % Apply 1 application topically 2 (two) times daily. Patient not taking: Reported on 01/28/2016 10/14/15   Emi Belfasteborah B Gessner, FNP    Family History Family History  Problem Relation Age of Onset  . Asthma Mother   . Stroke Father   . Cancer Father     . Asthma Sister     Social History Social History  Substance Use Topics  . Smoking status: Former Games developermoker  . Smokeless tobacco: Never Used  . Alcohol use No     Allergies   Fish allergy; Peanut-containing drug products; and Penicillins   Review of Systems Review of Systems   Review of Systems All other systems negative except as documented in the HPI. All pertinent positives and negatives as reviewed in the HPI.   Review of Systems All other systems negative except as documented in the HPI. All pertinent positives and negatives as reviewed in the HPI.Physical Exam Updated Vital Signs BP (!) 162/115   Pulse 74   Temp 98 F (36.7 C)   Resp 20   Ht 6\' 2"  (1.88 m)   Wt 79.4 kg   SpO2 99%   BMI 22.47 kg/m   Physical Exam  Constitutional: He appears well-developed and well-nourished. No distress.  HENT:  Head: Normocephalic and atraumatic.  Right Ear: Tympanic membrane and ear canal normal.  Left Ear: Tympanic membrane and ear canal normal.  Nose: Nose normal.  Mouth/Throat: Uvula is midline, oropharynx is clear and moist and mucous membranes are normal.  Eyes: Pupils are equal, round, and reactive to light.  Neck: Normal range of motion. Neck supple.  Cardiovascular: Normal rate and regular rhythm.   Pulmonary/Chest:  Effort normal. No accessory muscle usage. No apnea and no tachypnea. No respiratory distress. He has no decreased breath sounds. He has wheezes (diffuse, mild). He has no rhonchi. He has no rales.  Abdominal: Soft.  No signs of abdominal distention  Musculoskeletal:  No LE swelling  Neurological: He is alert.  Acting at baseline  Skin: Skin is warm and dry. No rash noted.  Nursing note and vitals reviewed.    ED Treatments / Results  Labs (all labs ordered are listed, but only abnormal results are displayed) Labs Reviewed - No data to display  EKG  EKG Interpretation None       Radiology No results found.  Procedures Procedures  (including critical care time)  Medications Ordered in ED Medications  predniSONE (DELTASONE) tablet 60 mg (not administered)  azithromycin (ZITHROMAX) tablet 500 mg (not administered)  albuterol (PROVENTIL) (2.5 MG/3ML) 0.083% nebulizer solution 5 mg (5 mg Nebulization Given 01/27/16 2332)  albuterol (PROVENTIL HFA;VENTOLIN HFA) 108 (90 Base) MCG/ACT inhaler 1-2 puff (1 puff Inhalation Given 01/28/16 0442)  albuterol (PROVENTIL) (2.5 MG/3ML) 0.083% nebulizer solution 2.5 mg (2.5 mg Nebulization Given 01/28/16 0442)  ipratropium (ATROVENT) nebulizer solution 0.5 mg (0.5 mg Nebulization Given 01/28/16 0442)     Initial Impression / Assessment and Plan / ED Course  I have reviewed the triage vital signs and the nursing notes.  Pertinent labs & imaging results that were available during my care of the patient were reviewed by me and considered in my medical decision making (see chart for details).  Clinical Course   Patient with mild signs and symptoms of asthma/RAD. Oxygen saturation is above 90%. No accessory muscle use, no cyanosis. Treated in the ED.  Patient feels improved after treatment. Will discharge with Prednisone, albuterol inhaler and Azithromycin. Pt instructed to follow up with PCP. Patient HDS. Discussed return precautions. Appears safe for discharge.     Final Clinical Impressions(s) / ED Diagnoses   Final diagnoses:  Asthma exacerbation    New Prescriptions New Prescriptions   AZITHROMYCIN (ZITHROMAX) 250 MG TABLET    Take 1 tablet (250 mg total) by mouth daily. Take first 2 tablets together, then 1 every day until finished.   PREDNISONE (DELTASONE) 10 MG TABLET    Take 2 tablets (20 mg total) by mouth daily.     Marlon Pel, PA-C 01/28/16 3086    Azalia Bilis, MD 01/28/16 [phone]

## 2016-02-09 ENCOUNTER — Other Ambulatory Visit: Payer: Self-pay | Admitting: Family Medicine

## 2016-02-09 DIAGNOSIS — L309 Dermatitis, unspecified: Secondary | ICD-10-CM

## 2016-02-16 ENCOUNTER — Emergency Department (HOSPITAL_COMMUNITY)
Admission: EM | Admit: 2016-02-16 | Discharge: 2016-02-16 | Disposition: A | Discharge status: Home / Self Care | Payer: BLUE CROSS/BLUE SHIELD | Attending: Emergency Medicine | Admitting: Emergency Medicine

## 2016-02-16 ENCOUNTER — Encounter (HOSPITAL_COMMUNITY): Payer: Self-pay | Admitting: Emergency Medicine

## 2016-02-16 DIAGNOSIS — Z91010 Allergy to peanuts: Secondary | ICD-10-CM | POA: Insufficient documentation

## 2016-02-16 DIAGNOSIS — L209 Atopic dermatitis, unspecified: Secondary | ICD-10-CM

## 2016-02-16 DIAGNOSIS — Z79899 Other long term (current) drug therapy: Secondary | ICD-10-CM | POA: Insufficient documentation

## 2016-02-16 DIAGNOSIS — J45909 Unspecified asthma, uncomplicated: Secondary | ICD-10-CM | POA: Insufficient documentation

## 2016-02-16 DIAGNOSIS — I10 Essential (primary) hypertension: Secondary | ICD-10-CM | POA: Insufficient documentation

## 2016-02-16 DIAGNOSIS — Z87891 Personal history of nicotine dependence: Secondary | ICD-10-CM | POA: Insufficient documentation

## 2016-02-16 MED ORDER — PREDNISONE 20 MG PO TABS
60.0000 mg | ORAL_TABLET | Freq: Once | ORAL | Status: AC
  Administered 2016-02-16: 60 mg via ORAL
  Filled 2016-02-16: qty 3

## 2016-02-16 MED ORDER — TRIAMCINOLONE ACETONIDE 0.1 % EX OINT: 1.0000 "application " | TOPICAL_OINTMENT | Freq: Two times a day (BID) | CUTANEOUS | 1 refills | Status: DC

## 2016-02-16 MED ORDER — PREDNISONE 10 MG (21) PO TBPK: 10.0000 mg | ORAL_TABLET | Freq: Every day | ORAL | 0 refills | Status: DC

## 2016-02-16 MED ORDER — HYDROXYZINE HCL 25 MG PO TABS: 25.0000 mg | ORAL_TABLET | Freq: Four times a day (QID) | ORAL | 0 refills | Status: DC

## 2016-02-16 MED ORDER — HYDROXYZINE HCL 25 MG PO TABS
25.0000 mg | ORAL_TABLET | Freq: Once | ORAL | Status: AC
  Administered 2016-02-16: 25 mg via ORAL
  Filled 2016-02-16: qty 1

## 2016-02-16 MED ORDER — TRIAMCINOLONE ACETONIDE 0.1 % EX OINT: 1.0000 "application " | TOPICAL_OINTMENT | Freq: Two times a day (BID) | CUTANEOUS | 0 refills | Status: DC

## 2016-02-16 NOTE — Discharge Instructions (Signed)
Take the prednisone as instructed on the package. This should reduce the eczema outbreak. Only use the triamcinolone ointment as needed on areas of future outbreaks. Do not begin using the triamcinolone ointment until after the prednisone is finished. It is strongly advised that you follow up with primary care provider on this issue for reevaluation and chronic management.  The hydroxyzine is to be used for itching and skin irritation.

## 2016-02-16 NOTE — ED Provider Notes (Signed)
[facility] DEPT Provider Note   CSN: [id number] Arrival date & time: 02/16/16  7078     History   Chief Complaint Chief Complaint  Patient presents with  . Allergic Reaction    HPI Christian Horton is a 50 y.o. male.  HPI   Christian Horton is a 50 y.o. male, with a history of eczema and asthma, presenting to the ED with Complaint of widespread eczema flareup. Patient complains of eczema lesions to the back, chest, arms, legs, and feet. This is been present for the last week. Patient denies fever/chills, nausea/vomiting, difficulty breathing, areas of infection, or any other complaints.   Past Medical History:  Diagnosis Date  . Allergy   . Asthma     Patient Active Problem List   Diagnosis Date Noted  . Atypical chest pain 02/18/2015  . Essential hypertension 02/18/2015  . Abnormal EKG 02/18/2015    Past Surgical History:  Procedure Laterality Date  . NO PAST SURGERIES         Home Medications    Prior to Admission medications   Medication Sig Start Date End Date Taking? Authorizing Provider  albuterol (PROVENTIL HFA;VENTOLIN HFA) 108 (90 Base) MCG/ACT inhaler Inhale 2 puffs into the lungs every 4 (four) hours as needed for wheezing or shortness of breath (cough, shortness of breath or wheezing.). Patient not taking: Reported on 01/28/2016 10/14/15   Emi Belfast, FNP  azithromycin (ZITHROMAX) 250 MG tablet Take 1 tablet (250 mg total) by mouth daily. Take first 2 tablets together, then 1 every day until finished. 01/28/16   Tiffany Neva Seat, PA-C  hydrOXYzine (ATARAX/VISTARIL) 25 MG tablet Take 1 tablet (25 mg total) by mouth every 6 (six) hours. 02/16/16   Savanah Bayles C Lyndell Gillyard, PA-C  predniSONE (STERAPRED UNI-PAK 21 TAB) 10 MG (21) TBPK tablet Take 1 tablet (10 mg total) by mouth daily. Take 6 tabs by mouth daily  for 2 days, then 5 tabs for 2 days, then 4 tabs for 2 days, then 3 tabs for 2 days, 2 tabs for 2 days, then 1 tab by mouth daily for 2 days 02/16/16   Hillard Danker  Gabriela Irigoyen, PA-C  triamcinolone ointment (KENALOG) 0.1 % Apply 1 application topically 2 (two) times daily. Patient not taking: Reported on 01/28/2016 10/14/15   Emi Belfast, FNP  triamcinolone ointment (KENALOG) 0.1 % Apply 1 application topically 2 (two) times daily. 02/16/16   Nadeen Shipman C Marionna Gonia, PA-C  triamcinolone ointment (KENALOG) 0.1 % Apply 1 application topically 2 (two) times daily. 02/16/16   Anselm Pancoast, PA-C    Family History Family History  Problem Relation Age of Onset  . Asthma Mother   . Stroke Father   . Cancer Father   . Asthma Sister     Social History Social History  Substance Use Topics  . Smoking status: Former Games developer  . Smokeless tobacco: Never Used  . Alcohol use No     Allergies   Fish allergy; Peanut-containing drug products; and Penicillins   Review of Systems Review of Systems  Constitutional: Negative for chills and fever.  Respiratory: Negative for cough and shortness of breath.   Gastrointestinal: Negative for nausea and vomiting.  Skin: Positive for rash. Negative for wound.  All other systems reviewed and are negative.    Physical Exam Updated Vital Signs BP 157/94 (BP Location: Right Arm)   Pulse 75   Temp 97.5 F (36.4 C) (Oral)   Resp 16   SpO2 100%   Physical Exam  Constitutional:  He appears well-developed and well-nourished. No distress.  HENT:  Head: Normocephalic and atraumatic.  Eyes: Conjunctivae are normal.  Neck: Neck supple.  Cardiovascular: Normal rate, regular rhythm and intact distal pulses.   Pulmonary/Chest: Effort normal and breath sounds normal. No respiratory distress.  Abdominal: Soft. There is no tenderness. There is no guarding.  Musculoskeletal: He exhibits no edema or tenderness.  Lymphadenopathy:    He has no cervical adenopathy.  Neurological: He is alert.  Skin: Skin is warm and dry. Rash noted. He is not diaphoretic.  Atopic dermatitis rash across the patient's entire arms, legs, and trunk. No areas  consistent with cellulitis at this time. No open wounds noted.  Psychiatric: He has a normal mood and affect. His behavior is normal.  Nursing note and vitals reviewed.    ED Treatments / Results  Labs (all labs ordered are listed, but only abnormal results are displayed) Labs Reviewed - No data to display  EKG  EKG Interpretation None       Radiology No results found.  Procedures Procedures (including critical care time)  Medications Ordered in ED Medications  predniSONE (DELTASONE) tablet 60 mg (60 mg Oral Given 02/16/16 0850)  hydrOXYzine (ATARAX/VISTARIL) tablet 25 mg (25 mg Oral Given 02/16/16 0849)     Initial Impression / Assessment and Plan / ED Course  I have reviewed the triage vital signs and the nursing notes.  Pertinent labs & imaging results that were available during my care of the patient were reviewed by me and considered in my medical decision making (see chart for details).  Clinical Course    Patient with severe eczema flare. Patient states that he has a PCP, but cannot afford to see them. Resources given to help patient selected a different PCP. Patient given specific instructions for prednisone and triamcinolone use. Return precautions discussed.    Final Clinical Impressions(s) / ED Diagnoses   Final diagnoses:  Atopic dermatitis    New Prescriptions New Prescriptions   HYDROXYZINE (ATARAX/VISTARIL) 25 MG TABLET    Take 1 tablet (25 mg total) by mouth every 6 (six) hours.   PREDNISONE (STERAPRED UNI-PAK 21 TAB) 10 MG (21) TBPK TABLET    Take 1 tablet (10 mg total) by mouth daily. Take 6 tabs by mouth daily  for 2 days, then 5 tabs for 2 days, then 4 tabs for 2 days, then 3 tabs for 2 days, 2 tabs for 2 days, then 1 tab by mouth daily for 2 days   TRIAMCINOLONE OINTMENT (KENALOG) 0.1 %    Apply 1 application topically 2 (two) times daily.   TRIAMCINOLONE OINTMENT (KENALOG) 0.1 %    Apply 1 application topically 2 (two) times daily.       Anselm PancoastShawn C Warnie Belair, PA-C 02/16/16 [id number]    Alvira MondayErin Schlossman, MD 02/18/16 1102

## 2016-02-16 NOTE — ED Notes (Signed)
Instructions given no questions

## 2016-02-16 NOTE — ED Triage Notes (Signed)
eczema flair up, congestion, eyes swollen red and itching. Been going on for several days. Tried Claritin but not helpful and benadryl. He thinks it made it worse.

## 2016-03-08 ENCOUNTER — Encounter (HOSPITAL_COMMUNITY): Payer: Self-pay | Admitting: Emergency Medicine

## 2016-03-08 ENCOUNTER — Emergency Department (HOSPITAL_COMMUNITY)
Admission: EM | Admit: 2016-03-08 | Discharge: 2016-03-08 | Disposition: A | Discharge status: Home / Self Care | Payer: BLUE CROSS/BLUE SHIELD | Attending: Emergency Medicine | Admitting: Emergency Medicine

## 2016-03-08 DIAGNOSIS — Z91010 Allergy to peanuts: Secondary | ICD-10-CM | POA: Insufficient documentation

## 2016-03-08 DIAGNOSIS — Y929 Unspecified place or not applicable: Secondary | ICD-10-CM | POA: Insufficient documentation

## 2016-03-08 DIAGNOSIS — Y939 Activity, unspecified: Secondary | ICD-10-CM | POA: Insufficient documentation

## 2016-03-08 DIAGNOSIS — J45909 Unspecified asthma, uncomplicated: Secondary | ICD-10-CM | POA: Insufficient documentation

## 2016-03-08 DIAGNOSIS — Z87891 Personal history of nicotine dependence: Secondary | ICD-10-CM | POA: Insufficient documentation

## 2016-03-08 DIAGNOSIS — I10 Essential (primary) hypertension: Secondary | ICD-10-CM | POA: Insufficient documentation

## 2016-03-08 DIAGNOSIS — Y999 Unspecified external cause status: Secondary | ICD-10-CM | POA: Insufficient documentation

## 2016-03-08 DIAGNOSIS — S20369A Insect bite (nonvenomous) of unspecified front wall of thorax, initial encounter: Secondary | ICD-10-CM | POA: Insufficient documentation

## 2016-03-08 DIAGNOSIS — W57XXXA Bitten or stung by nonvenomous insect and other nonvenomous arthropods, initial encounter: Secondary | ICD-10-CM | POA: Insufficient documentation

## 2016-03-08 DIAGNOSIS — L309 Dermatitis, unspecified: Secondary | ICD-10-CM

## 2016-03-08 MED ORDER — BACITRACIN ZINC 500 UNIT/GM EX OINT: TOPICAL_OINTMENT | Freq: Two times a day (BID) | CUTANEOUS | Status: DC

## 2016-03-08 MED ORDER — TRIAMCINOLONE ACETONIDE 0.5 % EX CREA
TOPICAL_CREAM | Freq: Two times a day (BID) | CUTANEOUS | Status: DC
  Administered 2016-03-08: 22:00:00 via TOPICAL
  Filled 2016-03-08: qty 15

## 2016-03-08 NOTE — ED Notes (Signed)
Pt is in stable condition upon d/c and ambulates from ED. 

## 2016-03-08 NOTE — ED Provider Notes (Signed)
[facility] Provider Note  By signing my name below, I, Christian Horton, attest that this documentation has been prepared under the direction and in the presence of [facility], [place]. Electronically Signed: Earmon Horton, ED Scribe. 03/08/16. 9:45 PM.    History   Chief Complaint Chief Complaint  Patient presents with  . Insect Bite    The history is provided by the patient and medical records. No language interpreter was used.    HPI Comments:  Christian Horton is a 50 y.o. homeless male with PMHx of eczema who presents to the Emergency Department complaining of an insect bite to the anterior chest that occurred about 4-5 days ago. He reports the area started as a sore and was draining. He states the area is now healing. He has not done anything to treat the area. He denies modifying factors. He denies fever, chills, nausea, vomiting. He does not have a PCP.   Past Medical History:  Diagnosis Date  . Allergy   . Asthma     Patient Active Problem List   Diagnosis Date Noted  . Atypical chest pain 02/18/2015  . Essential hypertension 02/18/2015  . Abnormal EKG 02/18/2015    Past Surgical History:  Procedure Laterality Date  . NO PAST SURGERIES       Home Medications    Prior to Admission medications   Medication Sig Start Date End Date Taking? Authorizing Provider  albuterol (PROVENTIL HFA;VENTOLIN HFA) 108 (90 Base) MCG/ACT inhaler Inhale 2 puffs into the lungs every 4 (four) hours as needed for wheezing or shortness of breath (cough, shortness of breath or wheezing.). Patient not taking: Reported on 01/28/2016 10/14/15   Emi Belfast, FNP  azithromycin (ZITHROMAX) 250 MG tablet Take 1 tablet (250 mg total) by mouth daily. Take first 2 tablets together, then 1 every day until finished. 01/28/16   Tiffany Neva Seat, PA-C  hydrOXYzine (ATARAX/VISTARIL) 25 MG tablet Take 1 tablet (25 mg total) by mouth every 6 (six) hours. 02/16/16   Shawn C Joy, PA-C  predniSONE  (STERAPRED UNI-PAK 21 TAB) 10 MG (21) TBPK tablet Take 1 tablet (10 mg total) by mouth daily. Take 6 tabs by mouth daily  for 2 days, then 5 tabs for 2 days, then 4 tabs for 2 days, then 3 tabs for 2 days, 2 tabs for 2 days, then 1 tab by mouth daily for 2 days 02/16/16   Anselm Pancoast, PA-C    Family History Family History  Problem Relation Age of Onset  . Asthma Mother   . Stroke Father   . Cancer Father   . Asthma Sister     Social History Social History  Substance Use Topics  . Smoking status: Former Games developer  . Smokeless tobacco: Never Used  . Alcohol use No     Allergies   Fish allergy; Peanut-containing drug products; and Penicillins   Review of Systems Review of Systems  Constitutional: Negative for chills and fever.  Gastrointestinal: Negative for nausea and vomiting.  Skin: Positive for color change.     Physical Exam Updated Vital Signs BP 144/91 (BP Location: Right Arm)   Pulse 98   Resp 18   SpO2 100%   Physical Exam  Constitutional: He is oriented to person, place, and time. He appears well-developed and well-nourished.  Eyes: EOM are normal.  Neck: Neck supple.  Pulmonary/Chest: Effort normal.  Abdominal: Soft. There is no tenderness.  Musculoskeletal: Normal range of motion.  Neurological: He is alert and oriented to  person, place, and time. No cranial nerve deficit.  Skin: Skin is warm and dry. There is erythema.  0.5 cm raised area to mid chest consistent with insect bite. No erythema or drainage. Dry, scaly skin consistent with eczema.  Nursing note and vitals reviewed.    ED Treatments / Results  DIAGNOSTIC STUDIES: Oxygen Saturation is 100% on RA, normal by my interpretation.   COORDINATION OF CARE: 9:42 PM- [facility] to [facility] care with a PCP. Will prescribe antibiotic ointment. Pt verbalizes understanding and agrees to plan.  Medications - No data to display   Labs (all labs ordered are listed,  but only abnormal results are displayed) Labs Reviewed - No data to display   Radiology No results found.  Procedures Procedures (including critical care time)  Medications Ordered in ED Medications - No data to display   Initial Impression / Assessment and Plan / ED Course  I have reviewed the triage vital signs and the nursing notes.  Clinical Course  50 y.o. male with insect bite to the chest stable for d/c without signs of infection. Eczema that is moderate/severe. Patient given Kenalog cream prior to d/c and instructed to f/u with his PCP.   Final Clinical Impressions(s) / ED Diagnoses   Final diagnoses:  Insect bite, initial encounter  Eczema, unspecified type    New Prescriptions Discharge Medication List as of 03/08/2016  9:51 PM     I personally performed the services described in this documentation, which was scribed in my presence. The recorded information has been reviewed and is accurate.    [address]Christian Cremeens GansM Graysen Woodyard, NP 03/10/16 0151    Linwood DibblesJon Knapp, MD 03/11/16 [phone]

## 2016-03-08 NOTE — ED Triage Notes (Signed)
Pt. reports itchy skin at upper chest from an insect bite this week .

## 2016-03-16 ENCOUNTER — Ambulatory Visit: Payer: Self-pay | Attending: Internal Medicine | Admitting: Physician Assistant

## 2016-03-16 VITALS — BP 157/93 | HR 96 | Temp 97.4°F | Resp 16 | Wt 196.6 lb

## 2016-03-16 DIAGNOSIS — Z91013 Allergy to seafood: Secondary | ICD-10-CM | POA: Insufficient documentation

## 2016-03-16 DIAGNOSIS — Z590 Homelessness: Secondary | ICD-10-CM | POA: Insufficient documentation

## 2016-03-16 DIAGNOSIS — J4540 Moderate persistent asthma, uncomplicated: Secondary | ICD-10-CM | POA: Insufficient documentation

## 2016-03-16 DIAGNOSIS — L280 Lichen simplex chronicus: Secondary | ICD-10-CM | POA: Insufficient documentation

## 2016-03-16 DIAGNOSIS — Z91010 Allergy to peanuts: Secondary | ICD-10-CM | POA: Insufficient documentation

## 2016-03-16 DIAGNOSIS — L309 Dermatitis, unspecified: Secondary | ICD-10-CM | POA: Insufficient documentation

## 2016-03-16 DIAGNOSIS — H01139 Eczematous dermatitis of unspecified eye, unspecified eyelid: Secondary | ICD-10-CM

## 2016-03-16 DIAGNOSIS — Z79899 Other long term (current) drug therapy: Secondary | ICD-10-CM | POA: Insufficient documentation

## 2016-03-16 DIAGNOSIS — Z880 Allergy status to penicillin: Secondary | ICD-10-CM | POA: Insufficient documentation

## 2016-03-16 DIAGNOSIS — R030 Elevated blood-pressure reading, without diagnosis of hypertension: Secondary | ICD-10-CM | POA: Insufficient documentation

## 2016-03-16 MED ORDER — EUCERIN EX CREA: TOPICAL_CREAM | CUTANEOUS | 0 refills | Status: DC | PRN

## 2016-03-16 MED ORDER — MOMETASONE FURO-FORMOTEROL FUM 200-5 MCG/ACT IN AERO: 2.0000 | INHALATION_SPRAY | Freq: Two times a day (BID) | RESPIRATORY_TRACT | 3 refills | Status: DC

## 2016-03-16 MED ORDER — CETIRIZINE HCL 10 MG PO TABS: 10.0000 mg | ORAL_TABLET | Freq: Every day | ORAL | 11 refills | Status: DC

## 2016-03-16 MED ORDER — TRIAMCINOLONE ACETONIDE 0.1 % EX OINT: 1.0000 "application " | TOPICAL_OINTMENT | Freq: Two times a day (BID) | CUTANEOUS | 3 refills | Status: DC

## 2016-03-16 MED FILL — TRIAMCINOLONE 0.1% OINTMENT: 0.1 | 15 days supply | Qty: 60 | Fill #0

## 2016-03-16 MED FILL — **DULERA 200 MCG/5 MCG INHA: 200-5 MCG | 30 days supply | Qty: 2 | Fill #0

## 2016-03-16 MED FILL — ?CETIRIZINE HCL 10 MG TABLE: 10 | 30 days supply | Qty: 30 | Fill #0

## 2016-03-16 NOTE — Patient Instructions (Addendum)
Check BP 3 times per week and record and bring to next visit.

## 2016-03-16 NOTE — Progress Notes (Signed)
Pt is in the office today for a ezcema  Pt states his insect bite is better Pt states it drained its self Pt states the bite doesn't bother him anymore Pt states he is having b/l feet pain Pt states both feets have blisters on them

## 2016-03-16 NOTE — Progress Notes (Signed)
Christian Horton, is a 50 y.o. male  NWG:[id number]  [id number]  DOB - 02/15/1966  Subjective:  Chief Complaint and HPI: Christian SenderSheldon Horton is a 50 y.o. male here today to establish care and for a follow up visit after being seen in the ED for an insect bite on his chest on 03/08/2016 and being seen and treated there for severe eczema in the past.  Today he continues to have trouble with severe eczema and pruritic skin which he has had his whole life.  He also has wheezing daily.  He is currently homeless and does not have transportation.  He is currently staying in a shelter.  He denies any other health problems.   Negative diabetes screening less than 1 year ago.  ED/Hospital notes reviewed.    ROS:   Constitutional:  No f/c, No night sweats, No unexplained weight loss. EENT:  No vision changes, No blurry vision, No hearing changes. No mouth, throat, or ear problems.  Respiratory: No cough, +wheezing Cardiac: No CP, no palpitations GI:  No abd pain, No N/V/D. GU: No Urinary s/sx Musculoskeletal: No joint pain Neuro: No headache, no dizziness, no motor weakness.  Skin: +rash Endocrine:  No polydipsia. No polyuria.  Psych: Denies SI/HI  No problems updated.  ALLERGIES: Allergies  Allergen Reactions  . Fish Allergy Anaphylaxis  . Peanut-Containing Drug Products Itching  . Penicillins Swelling    Has patient had a PCN reaction causing immediate rash, facial/tongue/throat swelling, SOB or lightheadedness with hypotension: Unknown Has patient had a PCN reaction causing severe rash involving mucus membranes or skin necrosis: Unknown Has patient had a PCN reaction that required hospitalization Unknown Has patient had a PCN reaction occurring within the last 10 years: Unknown If all of the above answers are "NO", then may proceed with Cephalosporin use.     PAST MEDICAL HISTORY: Past Medical History:  Diagnosis Date  . Allergy   . Asthma     MEDICATIONS AT HOME: Prior  to Admission medications   Medication Sig Start Date End Date Taking? Authorizing Provider  triamcinolone ointment (KENALOG) 0.1 % Apply 1 application topically 2 (two) times daily. 03/16/16  Yes Anders SimmondsAngela M Aunesti Pellegrino, PA-C  albuterol (PROVENTIL HFA;VENTOLIN HFA) 108 (90 Base) MCG/ACT inhaler Inhale 2 puffs into the lungs every 4 (four) hours as needed for wheezing or shortness of breath (cough, shortness of breath or wheezing.). Patient not taking: Reported on 03/16/2016 10/14/15   Emi Belfasteborah B Gessner, FNP  azithromycin (ZITHROMAX) 250 MG tablet Take 1 tablet (250 mg total) by mouth daily. Take first 2 tablets together, then 1 every day until finished. Patient not taking: Reported on 03/16/2016 01/28/16   Marlon Peliffany Greene, PA-C  cetirizine (ZYRTEC) 10 MG tablet Take 1 tablet (10 mg total) by mouth daily. 03/16/16   Anders SimmondsAngela M Maurisha Mongeau, PA-C  hydrOXYzine (ATARAX/VISTARIL) 25 MG tablet Take 1 tablet (25 mg total) by mouth every 6 (six) hours. Patient not taking: Reported on 03/16/2016 02/16/16   Shawn C Joy, PA-C  mometasone-formoterol (DULERA) 200-5 MCG/ACT AERO Inhale 2 puffs into the lungs 2 (two) times daily. 03/16/16   Anders SimmondsAngela M Cade Dashner, PA-C  predniSONE (STERAPRED UNI-PAK 21 TAB) 10 MG (21) TBPK tablet Take 1 tablet (10 mg total) by mouth daily. Take 6 tabs by mouth daily  for 2 days, then 5 tabs for 2 days, then 4 tabs for 2 days, then 3 tabs for 2 days, 2 tabs for 2 days, then 1 tab by mouth daily for 2 days Patient not  taking: Reported on 03/16/2016 02/16/16   Anselm Pancoast, PA-C  Skin Protectants, Misc. (EUCERIN) cream Apply topically as needed for dry skin. 03/16/16   Anders Simmonds, PA-C     Objective:  EXAM:   Vitals:   03/16/16 1659  BP: (!) 157/93  Pulse: 96  Resp: 16  Temp: 97.4 F (36.3 C)  TempSrc: Oral  SpO2: 99%  Weight: 196 lb 9.6 oz (89.2 kg)    General appearance : A&OX3. NAD. Non-toxic-appearing HEENT: Atraumatic and Normocephalic.  PERRLA. EOM intact.  TM clear B. Mouth-MMM,  post pharynx WNL w/o erythema, No PND. Neck: supple, no JVD. No cervical lymphadenopathy. No thyromegaly Chest/Lungs:  Breathing-non-labored, Good air entry bilaterally, breath sounds with moderate wheezing throughout.  No rales or rhonchi. CVS: S1 S2 regular, no murmurs, gallops, rubs Extremities: Bilateral Lower Ext shows no edema, both legs are warm to touch with = pulse throughout Neurology:  CN II-XII grossly intact, Non focal.   Psych:  TP linear. J/I WNL. Normal speech. Appropriate eye contact and affect.  Skin:  Almost with alligator type appearance with scaling and lichenification throughout.  No secondary infection or seeping of the skin  Data Review Lab Results  Component Value Date   HGBA1C 5.7 01/21/2015     Assessment & Plan   1. 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  Dispense: 80 g; Refill: 3 - Skin Protectants, Misc. (EUCERIN) cream; Apply topically as needed for dry skin.  Dispense: 454 g; Refill: 0 - cetirizine (ZYRTEC) 10 MG tablet; Take 1 tablet (10 mg total) by mouth daily.  Dispense: 30 tablet; Refill: 11 Defer dermatology referral until orange card or [facility] discount is in place.  Forms given today.  2. Moderate persistent chronic asthma without complication Will try - mometasone-formoterol (DULERA) 200-5 MCG/ACT AERO; Inhale 2 puffs into the lungs 2 (two) times daily.  Dispense: 1 Inhaler; Refill: 3  3. Elevated BP without diagnosis of hypertension Check BP OOO 3X per week and record.  Bring to next visit   Patient have been counseled extensively about nutrition and exercise  Return in about 3 weeks (around 04/06/2016) for establish with PCP; f/up BP and eczema.  The patient was given clear instructions to go to ER or return to medical center if symptoms don't improve, worsen or new problems develop. The patient verbalized  understanding. The patient was told to call to get lab results if they haven't heard anything in the next week.     Georgian Co, PA-C [facility] [place], [place] [phone]   03/16/2016, 5:26 PMPatient ID: Izic Heyd, male   DOB: 12/25/1965, 50 y.o.   MRN: [id number]

## 2016-04-10 MED FILL — TRIAMCINOLONE 0.1% OINTMENT: 0.1 | 15 days supply | Qty: 60 | Fill #1

## 2016-05-09 MED FILL — **DULERA 200 MCG/5 MCG INHA: 200-5 MCG | 30 days supply | Qty: 2 | Fill #1

## 2016-05-18 MED FILL — TRIAMCINOLONE 0.1% OINTMENT: 0.1 | 15 days supply | Qty: 60 | Fill #2

## 2016-05-23 ENCOUNTER — Ambulatory Visit (HOSPITAL_COMMUNITY)
Admission: EM | Admit: 2016-05-23 | Discharge: 2016-05-23 | Disposition: A | Discharge status: Home / Self Care | Payer: Self-pay | Attending: Family Medicine | Admitting: Family Medicine

## 2016-05-23 ENCOUNTER — Encounter (HOSPITAL_COMMUNITY): Payer: Self-pay | Admitting: Family Medicine

## 2016-05-23 ENCOUNTER — Other Ambulatory Visit: Payer: Self-pay | Admitting: Physician Assistant

## 2016-05-23 DIAGNOSIS — J4540 Moderate persistent asthma, uncomplicated: Secondary | ICD-10-CM

## 2016-05-23 DIAGNOSIS — Z760 Encounter for issue of repeat prescription: Secondary | ICD-10-CM

## 2016-05-23 MED ORDER — MOMETASONE FURO-FORMOTEROL FUM 200-5 MCG/ACT IN AERO: INHALATION_SPRAY | RESPIRATORY_TRACT | 0 refills | Status: DC

## 2016-05-23 MED ORDER — METHYLPREDNISOLONE ACETATE 80 MG/ML IJ SUSP
80.0000 mg | Freq: Once | INTRAMUSCULAR | Status: AC
  Administered 2016-05-23: 80 mg via INTRAMUSCULAR

## 2016-05-23 MED ORDER — IPRATROPIUM-ALBUTEROL 0.5-2.5 (3) MG/3ML IN SOLN
3.0000 mL | Freq: Once | RESPIRATORY_TRACT | Status: AC
  Administered 2016-05-23: 3 mL via RESPIRATORY_TRACT

## 2016-05-23 MED ORDER — SODIUM CHLORIDE 0.9 % IN NEBU
INHALATION_SOLUTION | RESPIRATORY_TRACT | Status: AC
  Filled 2016-05-23: qty 3

## 2016-05-23 MED ORDER — ALBUTEROL SULFATE HFA 108 (90 BASE) MCG/ACT IN AERS: 1.0000 | INHALATION_SPRAY | Freq: Four times a day (QID) | RESPIRATORY_TRACT | 0 refills | Status: DC | PRN

## 2016-05-23 MED ORDER — METHYLPREDNISOLONE ACETATE 80 MG/ML IJ SUSP
INTRAMUSCULAR | Status: AC
  Filled 2016-05-23: qty 1

## 2016-05-23 MED ORDER — IPRATROPIUM-ALBUTEROL 0.5-2.5 (3) MG/3ML IN SOLN
RESPIRATORY_TRACT | Status: AC
  Filled 2016-05-23: qty 3

## 2016-05-23 MED FILL — DULERA 200 MCG/5 MCG INH: 200-5 | 30 days supply | Qty: 1 | Fill #0

## 2016-05-23 MED FILL — !VENTOLIN HFA INHALER: 108 (90 BAS | 25 days supply | Qty: 18 | Fill #0

## 2016-05-23 NOTE — Discharge Instructions (Signed)
Both your rescue and your maintenance inhalers have been refilled today. I recommend you follow up with a primary care provider for further evaluation and management of your asthma symptoms. Should your symptoms worsen or fail to improve follow up with a primary care provider or return to clinic for further evaluation.

## 2016-05-23 NOTE — ED Provider Notes (Signed)
CSN: [id number]     Arrival date & time 05/23/16  1540 History   First MD Initiated Contact with Patient 05/23/16 1639     Chief Complaint  Patient presents with  . Medication Refill   (Consider location/radiation/quality/duration/timing/severity/associated sxs/prior Treatment) 51 year old male presents to clinic with request of refill of his asthma medications. Patient has prescription for albuterol inhaler for rescue and Dulera 200 mcg/5mg  for maintenance. He currently is not going to his PCP due to lack of insurance. Patient has been short of breath and has been using his rescue inhaler 3-4 times a day for last week, more so at night. Denies recent history of illness, but has had cough.   The history is provided by the patient.  Medication Refill    Past Medical History:  Diagnosis Date  . Allergy   . Asthma    Past Surgical History:  Procedure Laterality Date  . NO PAST SURGERIES     Family History  Problem Relation Age of Onset  . Asthma Mother   . Stroke Father   . Cancer Father   . Asthma Sister    Social History  Substance Use Topics  . Smoking status: Former Games developer  . Smokeless tobacco: Never Used  . Alcohol use No    Review of Systems  Constitutional: Negative.   HENT: Negative.   Eyes: Negative.   Respiratory: Positive for cough, chest tightness, shortness of breath and wheezing.   Cardiovascular: Negative.   Gastrointestinal: Negative.   Musculoskeletal: Negative.   Neurological: Negative.     Allergies  Fish allergy; Peanut-containing drug products; and Penicillins  Home Medications   Prior to Admission medications   Medication Sig Start Date End Date Taking? Authorizing Provider  albuterol (PROVENTIL HFA;VENTOLIN HFA) 108 (90 Base) MCG/ACT inhaler Inhale 2 puffs into the lungs every 4 (four) hours as needed for wheezing or shortness of breath (cough, shortness of breath or wheezing.). Patient not taking: Reported on 03/16/2016 10/14/15   Emi Belfast, FNP  albuterol (PROVENTIL HFA;VENTOLIN HFA) 108 (90 Base) MCG/ACT inhaler Inhale 1-2 puffs into the lungs every 6 (six) hours as needed for wheezing or shortness of breath. 05/23/16   Dorena Bodo, NP  azithromycin (ZITHROMAX) 250 MG tablet Take 1 tablet (250 mg total) by mouth daily. Take first 2 tablets together, then 1 every day until finished. Patient not taking: Reported on 03/16/2016 01/28/16   Marlon Pel, PA-C  cetirizine (ZYRTEC) 10 MG tablet Take 1 tablet (10 mg total) by mouth daily. 03/16/16   Anders Simmonds, PA-C  hydrOXYzine (ATARAX/VISTARIL) 25 MG tablet Take 1 tablet (25 mg total) by mouth every 6 (six) hours. Patient not taking: Reported on 03/16/2016 02/16/16   Shawn C Joy, PA-C  mometasone-formoterol (DULERA) 200-5 MCG/ACT AERO INHALE 2 PUFFS INTO THE LUNGS 2 TIMES DAILY. 05/23/16   Dorena Bodo, NP  predniSONE (STERAPRED UNI-PAK 21 TAB) 10 MG (21) TBPK tablet Take 1 tablet (10 mg total) by mouth daily. Take 6 tabs by mouth daily  for 2 days, then 5 tabs for 2 days, then 4 tabs for 2 days, then 3 tabs for 2 days, 2 tabs for 2 days, then 1 tab by mouth daily for 2 days Patient not taking: Reported on 03/16/2016 02/16/16   Anselm Pancoast, PA-C  Skin Protectants, Misc. (EUCERIN) cream Apply topically as needed for dry skin. 03/16/16   Anders Simmonds, PA-C  triamcinolone ointment (KENALOG) 0.1 % Apply 1 application topically 2 (two) times  daily. 03/16/16   Anders Simmonds, PA-C   Meds Ordered and Administered this Visit   Medications  methylPREDNISolone acetate (DEPO-MEDROL) injection 80 mg (80 mg Intramuscular Given 05/23/16 1707)  ipratropium-albuterol (DUONEB) 0.5-2.5 (3) MG/3ML nebulizer solution 3 mL (3 mLs Nebulization Given 05/23/16 1709)    BP 135/78   Pulse 107   Temp 98.5 F (36.9 C)   Resp 20   SpO2 100%  No data found.   Physical Exam  Constitutional: He is oriented to person, place, and time. He appears well-developed and well-nourished. No  distress.  HENT:  Head: Normocephalic.  Right Ear: External ear normal.  Left Ear: External ear normal.  Mouth/Throat: Oropharynx is clear and moist. No oropharyngeal exudate.  Eyes: Pupils are equal, round, and reactive to light.  Neck: Normal range of motion. Neck supple. No JVD present.  Cardiovascular: Normal rate and regular rhythm.   Pulmonary/Chest: No accessory muscle usage. No respiratory distress. He has wheezes in the right upper field, the right middle field, the right lower field, the left upper field, the left middle field and the left lower field.  Abdominal: Soft. Bowel sounds are normal.  Lymphadenopathy:    He has no cervical adenopathy.  Neurological: He is alert and oriented to person, place, and time.  Skin: Skin is warm and dry. Capillary refill takes less than 2 seconds. He is not diaphoretic.  Psychiatric: He has a normal mood and affect.    Urgent Care Course   Clinical Course     Procedures (including critical care time)  Labs Review Labs Reviewed - No data to display  Imaging Review No results found.   Visual Acuity Review  Right Eye Distance:   Left Eye Distance:   Bilateral Distance:    Right Eye Near:   Left Eye Near:    Bilateral Near:         MDM   1. Medication refill   2. Moderate persistent chronic asthma without complication    Patient given an injection of Methylprednisolone in clinic along with a DuoNeb treatment. Patient wheezing resolved and patient reports improvement in symptoms. Patient's Dulera and albuterol inhaler refilled. Patient advised to return to his primary care provider for management of his asthma. Should symptoms worsen or fail to resolve return to clinic or follow up with PCP.      Dorena Bodo, NP 05/23/16 1733

## 2016-05-23 NOTE — ED Triage Notes (Signed)
Pt here for refill on albuterol inhaler.

## 2016-05-29 ENCOUNTER — Other Ambulatory Visit: Payer: Self-pay | Admitting: *Deleted

## 2016-05-29 DIAGNOSIS — J4540 Moderate persistent asthma, uncomplicated: Secondary | ICD-10-CM

## 2016-05-29 MED ORDER — MOMETASONE FURO-FORMOTEROL FUM 200-5 MCG/ACT IN AERO: INHALATION_SPRAY | RESPIRATORY_TRACT | 3 refills | Status: DC

## 2016-05-29 MED ORDER — ALBUTEROL SULFATE HFA 108 (90 BASE) MCG/ACT IN AERS: 2.0000 | INHALATION_SPRAY | RESPIRATORY_TRACT | 3 refills | Status: DC | PRN

## 2016-05-29 NOTE — Telephone Encounter (Signed)
PRINTED FOR PASS PROGRAM 

## 2016-06-28 MED FILL — TRIAMCINOLONE 0.1% OINTMENT: 0.1 | 15 days supply | Qty: 60 | Fill #3

## 2016-08-03 MED FILL — TRIAMCINOLONE 0.1% OINTMENT: 0.1 | 15 days supply | Qty: 60 | Fill #4

## 2016-08-07 ENCOUNTER — Encounter (HOSPITAL_COMMUNITY): Payer: Self-pay | Admitting: Emergency Medicine

## 2016-08-07 ENCOUNTER — Emergency Department (HOSPITAL_COMMUNITY): Payer: Self-pay

## 2016-08-07 ENCOUNTER — Inpatient Hospital Stay (HOSPITAL_COMMUNITY)
Admission: EM | Admit: 2016-08-07 | Discharge: 2016-08-11 | DRG: 287 | Disposition: A | Discharge status: Home / Self Care | Payer: Self-pay | Attending: Internal Medicine | Admitting: Internal Medicine

## 2016-08-07 DIAGNOSIS — Z79899 Other long term (current) drug therapy: Secondary | ICD-10-CM

## 2016-08-07 DIAGNOSIS — R0600 Dyspnea, unspecified: Secondary | ICD-10-CM

## 2016-08-07 DIAGNOSIS — I10 Essential (primary) hypertension: Secondary | ICD-10-CM | POA: Diagnosis present

## 2016-08-07 DIAGNOSIS — Z91013 Allergy to seafood: Secondary | ICD-10-CM

## 2016-08-07 DIAGNOSIS — F1211 Cannabis abuse, in remission: Secondary | ICD-10-CM

## 2016-08-07 DIAGNOSIS — R918 Other nonspecific abnormal finding of lung field: Secondary | ICD-10-CM

## 2016-08-07 DIAGNOSIS — Z825 Family history of asthma and other chronic lower respiratory diseases: Secondary | ICD-10-CM

## 2016-08-07 DIAGNOSIS — J4521 Mild intermittent asthma with (acute) exacerbation: Secondary | ICD-10-CM | POA: Diagnosis present

## 2016-08-07 DIAGNOSIS — Y929 Unspecified place or not applicable: Secondary | ICD-10-CM

## 2016-08-07 DIAGNOSIS — Z840 Family history of diseases of the skin and subcutaneous tissue: Secondary | ICD-10-CM

## 2016-08-07 DIAGNOSIS — I4581 Long QT syndrome: Secondary | ICD-10-CM | POA: Diagnosis present

## 2016-08-07 DIAGNOSIS — Z91048 Other nonmedicinal substance allergy status: Secondary | ICD-10-CM

## 2016-08-07 DIAGNOSIS — R51 Headache: Secondary | ICD-10-CM | POA: Diagnosis not present

## 2016-08-07 DIAGNOSIS — Z886 Allergy status to analgesic agent status: Secondary | ICD-10-CM

## 2016-08-07 DIAGNOSIS — Z87891 Personal history of nicotine dependence: Secondary | ICD-10-CM

## 2016-08-07 DIAGNOSIS — R072 Precordial pain: Secondary | ICD-10-CM | POA: Diagnosis present

## 2016-08-07 DIAGNOSIS — J45909 Unspecified asthma, uncomplicated: Secondary | ICD-10-CM

## 2016-08-07 DIAGNOSIS — R590 Localized enlarged lymph nodes: Secondary | ICD-10-CM | POA: Diagnosis present

## 2016-08-07 DIAGNOSIS — I5041 Acute combined systolic (congestive) and diastolic (congestive) heart failure: Secondary | ICD-10-CM | POA: Diagnosis present

## 2016-08-07 DIAGNOSIS — L309 Dermatitis, unspecified: Secondary | ICD-10-CM | POA: Diagnosis present

## 2016-08-07 DIAGNOSIS — I110 Hypertensive heart disease with heart failure: Principal | ICD-10-CM | POA: Diagnosis present

## 2016-08-07 DIAGNOSIS — I2510 Atherosclerotic heart disease of native coronary artery without angina pectoris: Secondary | ICD-10-CM | POA: Diagnosis present

## 2016-08-07 DIAGNOSIS — I509 Heart failure, unspecified: Secondary | ICD-10-CM

## 2016-08-07 DIAGNOSIS — I5021 Acute systolic (congestive) heart failure: Secondary | ICD-10-CM

## 2016-08-07 DIAGNOSIS — I444 Left anterior fascicular block: Secondary | ICD-10-CM | POA: Diagnosis present

## 2016-08-07 DIAGNOSIS — E785 Hyperlipidemia, unspecified: Secondary | ICD-10-CM | POA: Diagnosis present

## 2016-08-07 DIAGNOSIS — T465X6A Underdosing of other antihypertensive drugs, initial encounter: Secondary | ICD-10-CM | POA: Diagnosis present

## 2016-08-07 DIAGNOSIS — I340 Nonrheumatic mitral (valve) insufficiency: Secondary | ICD-10-CM | POA: Diagnosis present

## 2016-08-07 DIAGNOSIS — Z823 Family history of stroke: Secondary | ICD-10-CM

## 2016-08-07 DIAGNOSIS — Z91010 Allergy to peanuts: Secondary | ICD-10-CM

## 2016-08-07 DIAGNOSIS — I472 Ventricular tachycardia: Secondary | ICD-10-CM | POA: Diagnosis not present

## 2016-08-07 DIAGNOSIS — I420 Dilated cardiomyopathy: Secondary | ICD-10-CM | POA: Diagnosis present

## 2016-08-07 DIAGNOSIS — J189 Pneumonia, unspecified organism: Secondary | ICD-10-CM

## 2016-08-07 DIAGNOSIS — Z590 Homelessness: Secondary | ICD-10-CM

## 2016-08-07 DIAGNOSIS — K5900 Constipation, unspecified: Secondary | ICD-10-CM | POA: Diagnosis present

## 2016-08-07 DIAGNOSIS — Z880 Allergy status to penicillin: Secondary | ICD-10-CM

## 2016-08-07 HISTORY — DX: Localized enlarged lymph nodes: R59.0

## 2016-08-07 HISTORY — DX: Essential (primary) hypertension: I10

## 2016-08-07 HISTORY — DX: Atherosclerotic heart disease of native coronary artery without angina pectoris: I25.10

## 2016-08-07 HISTORY — DX: Hyperlipidemia, unspecified: E78.5

## 2016-08-07 HISTORY — DX: Heart failure, unspecified: I50.9

## 2016-08-07 LAB — BASIC METABOLIC PANEL
Anion gap: 7 (ref 5–15)
BUN: 13 mg/dL (ref 6–20)
CHLORIDE: 104 mmol/L (ref 101–111)
CO2: 31 mmol/L (ref 22–32)
Calcium: 8.6 mg/dL — ABNORMAL LOW (ref 8.9–10.3)
Creatinine, Ser: 1.19 mg/dL (ref 0.61–1.24)
GFR calc non Af Amer: >60 mL/min (ref >60)
Glucose, Bld: 84 mg/dL (ref 65–99)
POTASSIUM: 3.8 mmol/L (ref 3.5–5.1)
SODIUM: 142 mmol/L (ref 135–145)

## 2016-08-07 LAB — CBC WITH DIFFERENTIAL/PLATELET
BASOS PCT: 0 %
Basophils Absolute: 0 10*3/uL (ref 0.0–0.1)
EOS ABS: 0.3 10*3/uL (ref 0.0–0.7)
Eosinophils Relative: 6 %
HEMATOCRIT: 42 % (ref 39.0–52.0)
HEMOGLOBIN: 13.8 g/dL (ref 13.0–17.0)
Lymphocytes Relative: 28 %
Lymphs Abs: 1.6 10*3/uL (ref 0.7–4.0)
MCH: 25.9 pg — ABNORMAL LOW (ref 26.0–34.0)
MCHC: 32.9 g/dL (ref 30.0–36.0)
MCV: 78.8 fL (ref 78.0–100.0)
MONOS PCT: 11 %
Monocytes Absolute: 0.6 10*3/uL (ref 0.1–1.0)
NEUTROS ABS: 3.1 10*3/uL (ref 1.7–7.7)
NEUTROS PCT: 55 %
Platelets: 197 10*3/uL (ref 150–400)
RBC: 5.33 MIL/uL (ref 4.22–5.81)
RDW: 15.4 % (ref 11.5–15.5)
WBC: 5.7 10*3/uL (ref 4.0–10.5)

## 2016-08-07 LAB — I-STAT TROPONIN, ED: TROPONIN I, POC: 0.03 ng/mL (ref 0.00–0.08)

## 2016-08-07 LAB — BRAIN NATRIURETIC PEPTIDE: B NATRIURETIC PEPTIDE 5: 1301.7 pg/mL — AB (ref 0.0–100.0)

## 2016-08-07 MED ORDER — SODIUM CHLORIDE 0.9% FLUSH: 3.0000 mL | INTRAVENOUS | Status: DC | PRN

## 2016-08-07 MED ORDER — ENOXAPARIN SODIUM 40 MG/0.4ML ~~LOC~~ SOLN
40.0000 mg | SUBCUTANEOUS | Status: DC
  Administered 2016-08-07 – 2016-08-09 (×3): 40 mg via SUBCUTANEOUS
  Filled 2016-08-07 (×3): qty 0.4

## 2016-08-07 MED ORDER — SODIUM CHLORIDE 0.9 % IV SOLN: 250.0000 mL | INTRAVENOUS | Status: DC | PRN

## 2016-08-07 MED ORDER — HYDROCERIN EX CREA
TOPICAL_CREAM | Freq: Two times a day (BID) | CUTANEOUS | Status: DC
  Administered 2016-08-07: 1 via TOPICAL
  Administered 2016-08-08 – 2016-08-10 (×6): via TOPICAL
  Filled 2016-08-07: qty 113

## 2016-08-07 MED ORDER — LISINOPRIL 10 MG PO TABS
20.0000 mg | ORAL_TABLET | Freq: Every day | ORAL | Status: DC
  Administered 2016-08-07 – 2016-08-11 (×5): 20 mg via ORAL
  Filled 2016-08-07 (×6): qty 2

## 2016-08-07 MED ORDER — MOMETASONE FURO-FORMOTEROL FUM 200-5 MCG/ACT IN AERO
2.0000 | INHALATION_SPRAY | Freq: Two times a day (BID) | RESPIRATORY_TRACT | Status: DC
  Administered 2016-08-07 – 2016-08-11 (×7): 2 via RESPIRATORY_TRACT
  Filled 2016-08-07 (×2): qty 8.8

## 2016-08-07 MED ORDER — PREDNISONE 20 MG PO TABS
60.0000 mg | ORAL_TABLET | Freq: Once | ORAL | Status: AC
  Administered 2016-08-07: 60 mg via ORAL
  Filled 2016-08-07: qty 3

## 2016-08-07 MED ORDER — SODIUM CHLORIDE 0.9% FLUSH: 3.0000 mL | Freq: Two times a day (BID) | INTRAVENOUS | Status: DC

## 2016-08-07 MED ORDER — FUROSEMIDE 10 MG/ML IJ SOLN
40.0000 mg | Freq: Once | INTRAMUSCULAR | Status: AC
  Administered 2016-08-07: 40 mg via INTRAVENOUS
  Filled 2016-08-07: qty 4

## 2016-08-07 MED ORDER — LEVOFLOXACIN IN D5W 750 MG/150ML IV SOLN
750.0000 mg | Freq: Once | INTRAVENOUS | Status: AC
  Administered 2016-08-07: 750 mg via INTRAVENOUS
  Filled 2016-08-07: qty 150

## 2016-08-07 MED ORDER — IOPAMIDOL (ISOVUE-370) INJECTION 76%
INTRAVENOUS | Status: AC
  Administered 2016-08-07: 100 mL
  Filled 2016-08-07: qty 100

## 2016-08-07 MED ORDER — ACETAMINOPHEN 325 MG PO TABS: 650.0000 mg | ORAL_TABLET | ORAL | Status: DC | PRN

## 2016-08-07 MED ORDER — IPRATROPIUM-ALBUTEROL 0.5-2.5 (3) MG/3ML IN SOLN
3.0000 mL | Freq: Once | RESPIRATORY_TRACT | Status: AC
Start: 2016-08-07 — End: 2016-08-07
  Administered 2016-08-07: 3 mL via RESPIRATORY_TRACT
  Filled 2016-08-07: qty 3

## 2016-08-07 MED ORDER — IPRATROPIUM-ALBUTEROL 0.5-2.5 (3) MG/3ML IN SOLN: 3.0000 mL | Freq: Four times a day (QID) | RESPIRATORY_TRACT | Status: DC | PRN

## 2016-08-07 MED ORDER — ONDANSETRON HCL 4 MG/2ML IJ SOLN: 4.0000 mg | Freq: Four times a day (QID) | INTRAMUSCULAR | Status: DC | PRN

## 2016-08-07 NOTE — ED Notes (Signed)
Patient transported to X-ray 

## 2016-08-07 NOTE — ED Notes (Signed)
Pt reports no improvement to breathing after receiving neb tx. Pt is resting comfortably and in NAD. No wheezing noted upon ascultation. No accessory muscle usage or tripoding.

## 2016-08-07 NOTE — ED Notes (Signed)
Unable to obtain accurate SpO2 reading at multiple sites. Switching to EtCO2 when pt returns from XR.

## 2016-08-07 NOTE — ED Notes (Signed)
Nurse starting IV and will draw labs. 

## 2016-08-07 NOTE — ED Triage Notes (Signed)
Pt reports having difficulty breathing d/t asthma. Has used inhaler with no relief.

## 2016-08-07 NOTE — ED Provider Notes (Signed)
11:20 AM pt received in signout from Dr. Blinda Leatherwood- CT chest shows pneumonia but also CHF- pt does not have this diagnosis.  EKG shows ongoing changes of LVH, troponin and BNP obtained and BNP is elevated at > 1000- pt has ongoing O2 requirement of between 1-2 liters.  D/w internal medicine teaching service for admission.  They will see patient in the ED and write admission orders.     Jerelyn Scott, MD 08/07/16 1122

## 2016-08-07 NOTE — ED Notes (Signed)
Patient transported to CT 

## 2016-08-07 NOTE — H&P (Signed)
Date: 08/07/2016               Patient Name:  Christian Horton MRN: [id number]  DOB: 23-Aug-1965 Age / Sex: 51 y.o., male   PCP: Christian Neas, PA-C         Medical Service: Internal Medicine Teaching Service         Attending Physician: Dr. Earl Lagos, MD    First Contact: Dr. Althia Forts Pager: [phone]  Second Contact: Dr. John Giovanni Pager: [phone]       After Hours (After 5p/  First Contact Pager: [phone]  weekends / holidays): Second Contact Pager: [phone]   Chief Complaint: short of breath  History of Present Illness: [name] is a 51 year old man with hypertension, asthma, eczema who presented to the ED with feeling short of breath over the last few days. His breathing has limited his ability to walk was associated with wheezing though was not relieved with albuterol. His sister died from asthma, so his concern brought him to the ED today. He has gained 15-20 lbs over the preceding months while experiencing intermittent chest discomfort he describes as "a needle in my chest" that has been increasing in frequency recently. His asthma is worse in the spring, and he also takes mometasone-formoterol 2 puffs twice daily for maintenance therapy. He denies any recent fever, chills, sick contacts, productive cough, dizziness, nausea, vomiting. Per review of the chart, he underwent stress testing in 2016 which was interpreted reassuring for no symptoms of ischemic disease. Though he has had elevated blood pressure readings in the past, he has been off medicine for the last several months because of his diet and exercise. He is currently homeless and is originally from [place]. He used to smoke 1/2 pack/day x 13 years but quit 20 years ago and has smoked marijuana in the past.   In the ED, initial labwork was notable for elevated BNP 1301.7 and negative troponin. CXR revealed new interstitial infiltrates, and subsequent CTA confirmed bilateral interstitial edema,  airspace consolidation in the right upper lobe and right lower lobe suggestive of pneumonia, and mild cardiomegaly. He received levofloxacin, furosemide 40 mg IV, prednisone 40 mg, and Duonebs.  Meds:  Current Meds  Medication Sig  . albuterol (PROVENTIL HFA;VENTOLIN HFA) 108 (90 Base) MCG/ACT inhaler Inhale 2 puffs into the lungs every 4 (four) hours as needed for wheezing or shortness of breath (cough, shortness of breath or wheezing.).  Marland Kitchen cetirizine (ZYRTEC) 10 MG tablet Take 1 tablet (10 mg total) by mouth daily. (Patient taking differently: Take 10 mg by mouth daily as needed for allergies. )  . mometasone-formoterol (DULERA) 200-5 MCG/ACT AERO INHALE 2 PUFFS INTO THE LUNGS 2 TIMES DAILY. (Patient taking differently: Inhale 2 puffs into the lungs 2 (two) times daily as needed for wheezing or shortness of breath. )  . Skin Protectants, Misc. (EUCERIN) cream Apply topically as needed for dry skin.  Marland Kitchen triamcinolone ointment (KENALOG) 0.1 % Apply 1 application topically 2 (two) times daily. (Patient taking differently: Apply 1 application topically 2 (two) times daily as needed (rash). )     Allergies: Allergies as of 08/07/2016 - Review Complete 08/07/2016  Allergen Reaction Noted  . Fish allergy Anaphylaxis 07/06/2015  . Peanut-containing drug products Itching 07/06/2015  . Penicillins Swelling 12/30/2014   Past Medical History:  Diagnosis Date  . Allergy   . Asthma     Family History: Mother has eczema.  Social History: Works in Publishing copy. Substance use  history as noted above.  Review of Systems: A complete ROS was negative except as per HPI.   Physical Exam: Blood pressure (!) 159/104, pulse (!) 102, temperature 97.2 F (36.2 C), temperature source Axillary, resp. rate 19, height 6\' 2"  (1.88 m), weight 180 lb (81.6 kg), SpO2 97 %. Physical Exam  Constitutional: He is oriented to person, place, and time. No distress.  HENT:  Head: Normocephalic and atraumatic.  Eyes:  Conjunctivae are normal. No scleral icterus.  Neck: JVD (8-9 cm above sternal notch) present.  Cardiovascular: Normal rate and regular rhythm.   Pulmonary/Chest: Effort normal. No respiratory distress. He has wheezes (Mild in the bases).  Abdominal: Soft. Bowel sounds are normal. He exhibits no distension.  Musculoskeletal: He exhibits edema (2+ bilateral lower extremities).  Neurological: He is alert and oriented to person, place, and time.  Skin: Skin is warm. He is not diaphoretic.  Dryness noted all over.   CXR: I reviewed and compared with 07/06/15. Interstitial edema, R>L. Emphysematous changes noted in both lungs.  Assessment & Plan by Problem: Principal Problem:   Dyspnea Active Problems:   Essential hypertension  [name] is a 51 year old man with hypertension, asthma, eczema hospitalized for dyspnea.   Dyspnea: His symptoms and physical exam findings suggest new-onset CHF and are concordant with CT imaging findings. His risk factors appears to be longstanding hypertension. Unclear why he has consolidation in his right lobes on CT imaging as infection is less likely in the absence of symptoms and no mass lesion was evident. Occupational exposure raises concern for pneumoconioses which may affect his cardiopulmonary reserve. Though he has asthma, he is not hypoxic nor does he have generalized wheezing which would be more consistent with acute asthma exacerbation. ACS is less likely given his time course. No anemia noted on admission CBC which could be contributory. -Check echo to assess cardiac function -Check EKG. Unclear which EKG was reviewed in the ED as none is available in the system for me to review. -Monitor ins and outs and daily weights -Review imaging with Radiology   Asthma: No PFTs on file. -Continue Duonebs every 6 hours as needed and mometasone/formoterol 2 puffs twice daily  Hypertension: Start lisinopril 20 mg today.  Eczema: Start hydrocerin mosturizer  twice daily.  Dispo: Admit patient to Observation with expected length of stay less than 2 midnights.  Signed: Beather Arbour, MD 08/07/2016, 2:47 PM  Pager: [phone]

## 2016-08-07 NOTE — ED Provider Notes (Signed)
[facility]-EMERGENCY DEPT Provider Note   CSN: [id number] Arrival date & time: 08/07/16  4098     History   Chief Complaint Chief Complaint  Patient presents with  . Shortness of Breath    HPI Christian Horton is a 51 y.o. male.  Patient presents to the ER for evaluation of shortness of breath. Patient reports a lifelong history of asthma. He has had increased wheezing and shortness of breath today, not responsive to his inhaler. He reports that he did go to sleep tonight, but he woke up more short of breath. He is concerned that this might mean that he has sleep apnea.      Past Medical History:  Diagnosis Date  . Allergy   . Asthma     Patient Active Problem List   Diagnosis Date Noted  . Atypical chest pain 02/18/2015  . Essential hypertension 02/18/2015  . Abnormal EKG 02/18/2015    Past Surgical History:  Procedure Laterality Date  . NO PAST SURGERIES         Home Medications    Prior to Admission medications   Medication Sig Start Date End Date Taking? Authorizing Provider  albuterol (PROVENTIL HFA;VENTOLIN HFA) 108 (90 Base) MCG/ACT inhaler Inhale 2 puffs into the lungs every 4 (four) hours as needed for wheezing or shortness of breath (cough, shortness of breath or wheezing.). 05/29/16  Yes Quentin Angst, MD  cetirizine (ZYRTEC) 10 MG tablet Take 1 tablet (10 mg total) by mouth daily. Patient taking differently: Take 10 mg by mouth daily as needed for allergies.  03/16/16  Yes Angela M McClung, PA-C  mometasone-formoterol (DULERA) 200-5 MCG/ACT AERO INHALE 2 PUFFS INTO THE LUNGS 2 TIMES DAILY. Patient taking differently: Inhale 2 puffs into the lungs 2 (two) times daily as needed for wheezing or shortness of breath.  05/29/16  Yes Quentin Angst, MD  Skin Protectants, Misc. (EUCERIN) cream Apply topically as needed for dry skin. 03/16/16  Yes Anders Simmonds, PA-C  triamcinolone ointment (KENALOG) 0.1 % Apply 1 application topically 2 (two) times  daily. Patient taking differently: Apply 1 application topically 2 (two) times daily as needed (rash).  03/16/16  Yes Anders Simmonds, PA-C    Family History Family History  Problem Relation Age of Onset  . Asthma Mother   . Stroke Father   . Cancer Father   . Asthma Sister     Social History Social History  Substance Use Topics  . Smoking status: Former Games developer  . Smokeless tobacco: Never Used  . Alcohol use No     Allergies   Fish allergy; Peanut-containing drug products; and Penicillins   Review of Systems Review of Systems  Constitutional: Negative for fever.  Respiratory: Positive for shortness of breath and wheezing. Negative for cough.   Cardiovascular: Negative for chest pain.  All other systems reviewed and are negative.    Physical Exam Updated Vital Signs BP (!) 139/96   Pulse 96   Temp 97.2 F (36.2 C) (Axillary)   Resp 18   Ht 6\' 2"  (1.88 m)   Wt 180 lb (81.6 kg)   SpO2 97%   BMI 23.11 kg/m   Physical Exam  Constitutional: He is oriented to person, place, and time. He appears well-developed and well-nourished. No distress.  HENT:  Head: Normocephalic and atraumatic.  Right Ear: Hearing normal.  Left Ear: Hearing normal.  Nose: Nose normal.  Mouth/Throat: Oropharynx is clear and moist and mucous membranes are normal.  Eyes: Conjunctivae and  EOM are normal. Pupils are equal, round, and reactive to light.  Neck: Normal range of motion. Neck supple.  Cardiovascular: Regular rhythm, S1 normal and S2 normal.  Exam reveals no gallop and no friction rub.   No murmur heard. Pulmonary/Chest: Effort normal. No respiratory distress. He has decreased breath sounds. He has wheezes. He exhibits no tenderness.  Abdominal: Soft. Normal appearance and bowel sounds are normal. There is no hepatosplenomegaly. There is no tenderness. There is no rebound, no guarding, no tenderness at McBurney's point and negative Murphy's sign. No hernia.  Musculoskeletal: Normal  range of motion.  Neurological: He is alert and oriented to person, place, and time. He has normal strength. No cranial nerve deficit or sensory deficit. Coordination normal. GCS eye subscore is 4. GCS verbal subscore is 5. GCS motor subscore is 6.  Skin: Skin is warm, dry and intact. No rash noted. No cyanosis.  Psychiatric: He has a normal mood and affect. His speech is normal and behavior is normal. Thought content normal.  Nursing note and vitals reviewed.    ED Treatments / Results  Labs (all labs ordered are listed, but only abnormal results are displayed) Labs Reviewed  CBC WITH DIFFERENTIAL/PLATELET - Abnormal; Notable for the following:       Result Value   MCH 25.9 (*)    All other components within normal limits  BASIC METABOLIC PANEL - Abnormal; Notable for the following:    Calcium 8.6 (*)    All other components within normal limits    EKG  EKG Interpretation None       Radiology Dg Chest 2 View  Result Date: 08/07/2016 CLINICAL DATA:  Difficulty breathing. Shortness of breath. History of asthma. EXAM: CHEST  2 VIEW COMPARISON:  07/06/2015 FINDINGS: Normal heart size and pulmonary vascularity. Diffuse emphysematous changes in the lungs. Interstitial infiltrates in the lung bases are new since previous study suggesting edema or interstitial pneumonia. There is developing right perihilar opacity which could represent perihilar consolidation or mass lesion. CT suggested for further evaluation. No blunting of costophrenic angles. No pneumothorax. IMPRESSION: Emphysematous changes in the lungs. New interstitial infiltrates in the lung bases. New mass or consolidation in the right hilum. CT suggested for further evaluation. Electronically Signed   By: Burman Nieves M.D.   On: 08/07/2016 06:21    Procedures Procedures (including critical care time)  Medications Ordered in ED Medications  ipratropium-albuterol (DUONEB) 0.5-2.5 (3) MG/3ML nebulizer solution 3 mL (3 mLs  Nebulization Given 08/07/16 0552)  predniSONE (DELTASONE) tablet 60 mg (60 mg Oral Given 08/07/16 0551)     Initial Impression / Assessment and Plan / ED Course  I have reviewed the triage vital signs and the nursing notes.  Pertinent labs & imaging results that were available during my care of the patient were reviewed by me and considered in my medical decision making (see chart for details).     Patient presents to the ER for evaluation of shortness of breath. Patient reports a history of asthma. He has been having increasing shortness of breath, but has not been responding to his albuterol inhaler. Examination did reveal diminished breath sounds with scattered wheezing, but no respiratory distress. Oxygenation was normal. Patient was administered DuoNeb, prednisone and had x-ray performed. X-ray shows opacity at the area of the hilum, possibly consolidation or mass. Patient will undergo CT scan. Will sign out to oncoming ER physician to follow up scan and treat/disposition.  Final Clinical Impressions(s) / ED Diagnoses   Final  diagnoses:  Mild intermittent asthma with exacerbation    New Prescriptions New Prescriptions   No medications on file     Gilda Crease, MD 08/07/16 [phone]

## 2016-08-07 NOTE — ED Notes (Signed)
Admitting MD at bedside.

## 2016-08-08 ENCOUNTER — Inpatient Hospital Stay (HOSPITAL_COMMUNITY): Payer: Self-pay

## 2016-08-08 DIAGNOSIS — J810 Acute pulmonary edema: Secondary | ICD-10-CM

## 2016-08-08 DIAGNOSIS — J90 Pleural effusion, not elsewhere classified: Secondary | ICD-10-CM

## 2016-08-08 DIAGNOSIS — I509 Heart failure, unspecified: Secondary | ICD-10-CM

## 2016-08-08 HISTORY — DX: Heart failure, unspecified: I50.9

## 2016-08-08 LAB — BASIC METABOLIC PANEL
ANION GAP: 8 (ref 5–15)
BUN: 15 mg/dL (ref 6–20)
CO2: 31 mmol/L (ref 22–32)
Calcium: 8.9 mg/dL (ref 8.9–10.3)
Chloride: 100 mmol/L — ABNORMAL LOW (ref 101–111)
Creatinine, Ser: 1.22 mg/dL (ref 0.61–1.24)
GFR calc Af Amer: >60 mL/min (ref >60)
Glucose, Bld: 89 mg/dL (ref 65–99)
POTASSIUM: 3.7 mmol/L (ref 3.5–5.1)
SODIUM: 139 mmol/L (ref 135–145)

## 2016-08-08 LAB — HIV ANTIBODY (ROUTINE TESTING W REFLEX): HIV SCREEN 4TH GENERATION: NONREACTIVE

## 2016-08-08 LAB — PROCALCITONIN

## 2016-08-08 MED ORDER — FUROSEMIDE 10 MG/ML IJ SOLN
40.0000 mg | Freq: Two times a day (BID) | INTRAMUSCULAR | Status: DC
  Administered 2016-08-08 – 2016-08-09 (×3): 40 mg via INTRAVENOUS
  Filled 2016-08-08 (×3): qty 4

## 2016-08-08 NOTE — Progress Notes (Signed)
  Echocardiogram 2D Echocardiogram has been performed.  Arvil Chaco 08/08/2016, 5:51 PM

## 2016-08-08 NOTE — Progress Notes (Signed)
Clinical Social Worker was consulted due to patient stating he was homeless.CSW met patient at bedside to offer support and discuss shelter resources. CSW printed out shelter resources and encouraged patient to reach out to shelters for possible placement at discharge.   Rhea Pink, MSW,  [place]

## 2016-08-08 NOTE — Care Management Note (Signed)
Case Management Note Donn Pierini RN, BSN Unit 2W-Case Manager [phone]  Patient Details  Name: Christian Horton MRN: [id number] Date of Birth: 1965-07-07  Subjective/Objective:  Pt presented with dyspnea                  Action/Plan: PTA pt states he is homeless- reports that he stays in the shelters at times- reports that he knows the shelter resources. Pt reports that he uses [facility] pharmacy at times for his medications- he also has a PCP- Dr. Chestine Spore- that he will f/u with- also advised pt to look into calling [facility] to see if he can establish there as a pt and look into getting an orange card if he meets the eligibility. Pt states he has their # and will call for appointment. CM will follow for any medication assistance needed at d/c- as long as pt on generic $4 meds- pt will not need MATCH assistance.   Expected Discharge Date:                  Expected Discharge Plan:  Home/Self Care  In-House Referral:     Discharge planning Services  CM Consult  Post Acute Care Choice:  NA Choice offered to:  NA  DME Arranged:    DME Agency:     HH Arranged:    HH Agency:     Status of Service:  Completed, signed off  If discussed at [employer] of Stay Meetings, dates discussed:    Additional Comments:  Darrold Span, RN 08/08/2016, 10:40 AM

## 2016-08-08 NOTE — Progress Notes (Signed)
   Subjective: Feeling better today. Breathing has improved although had one brief episode of dyspnea this AM. Reports good urine output yesterday. Discussed plan to continue diuresis and obtain an echo today and he voiced understanding.   Telemetry reviewed, some episodes of sinus tach to HR 120s  Objective: Vital signs in last 24 hours: Vitals:   08/07/16 1458 08/07/16 1501 08/07/16 2041 08/08/16 0341  BP: (!) 151/99  (!) 138/98 (!) 147/100  Pulse: 100  94 91  Resp: 18  18 18   Temp: 98.1 F (36.7 C)  98.6 F (37 C) 98.1 F (36.7 C)  TempSrc: Oral  Oral Oral  SpO2: 100% 100% 100% 100%  Weight:    189 lb 8 oz (86 kg)  Height:        Intake/Output Summary (Last 24 hours) at 08/08/16 0932 Last data filed at 08/08/16 8115  Gross per 24 hour  Intake             2320 ml  Output              800 ml  Net             1520 ml   Filed Weights   08/07/16 0543 08/08/16 0341  Weight: 180 lb (81.6 kg) 189 lb 8 oz (86 kg)   Physical Exam General appearance: [name] resting comfortably on side of bed, diffuse dry/rough appearing skin HENT: Normocephalic, atraumatic, moist MMs Cardiovascular: Regular rate and rhythm, no murmurs, rubs, gallops Respiratory/Chest: Clear to ausculation bilaterally, normal work of breathing Abdomen: Soft, non-tender, non-distended Skin: Warm, dry, diffuse coarse appearing skin Ext: Well perfused, trace/1+ pitting edema of BLEs Psych: Appropriate affect  Labs / Imaging / Procedures: CBC Latest Ref Rng & Units 08/07/2016 07/06/2015 01/21/2015  WBC 4.0 - 10.5 K/uL 5.7 3.1(L) 4.0(A)  Hemoglobin 13.0 - 17.0 g/dL 72.6 20.3 13.6(A)  Hematocrit 39.0 - 52.0 % 42.0 43.9 42.1(A)  Platelets 150 - 400 K/uL 197 233 -   BMP Latest Ref Rng & Units 08/08/2016 08/07/2016 07/06/2015  Glucose 65 - 99 mg/dL 89 84 86  BUN 6 - 20 mg/dL 15 13 16   Creatinine 0.61 - 1.24 mg/dL 5.59 7.41 6.38(G)  Sodium 135 - 145 mmol/L 139 142 135  Potassium 3.5 - 5.1 mmol/L 3.7  3.8 3.7  Chloride 101 - 111 mmol/L 100(L) 104 98(L)  CO2 22 - 32 mmol/L 31 31 26   Calcium 8.9 - 10.3 mg/dL 8.9 5.3(M) 8.9   No results found.  Assessment/Plan: Mr. Josealejandro Ashcraft is a 51 y.o. man with PMH asthma anduntreated HTN admitted for dyspnea and presumed CHF.   Principal Problem:   Dyspnea Active Problems:   Essential hypertension   Mild intermittent asthma with exacerbation   Congestive heart failure (HCC)  Presumed CHF, presenting with dyspnea and exercise intolerance, imaging significant for cardiomegaly, pleural effusions, and bibasilar interstitial edema. History of untreated HTN and prominent LVH apparent on EKG. Symptoms improving with diuresis. -- Continue Lasix today, accel to 40 mg IV BID -- Strict I/Os and daily weights -- Follow TTE and plan on cards consult if CHF -- Telemetry  Asthma, No PFTs on file. -- Duonebs Q6H prn -- Dulera BID  Hypertension -- start Lisinopril 20 mg daily  Eczema -- hydrocerin mosturizer twice daily  Dispo: Anticipated discharge in approximately 1-2 day(s).    LOS: 0 days   Althia Forts, MD 08/08/2016, 9:32 AM Pager: [phone]

## 2016-08-09 ENCOUNTER — Encounter (HOSPITAL_COMMUNITY): Payer: Self-pay | Admitting: Cardiology

## 2016-08-09 DIAGNOSIS — I10 Essential (primary) hypertension: Secondary | ICD-10-CM

## 2016-08-09 DIAGNOSIS — R590 Localized enlarged lymph nodes: Secondary | ICD-10-CM

## 2016-08-09 DIAGNOSIS — I420 Dilated cardiomyopathy: Secondary | ICD-10-CM

## 2016-08-09 DIAGNOSIS — I5021 Acute systolic (congestive) heart failure: Secondary | ICD-10-CM

## 2016-08-09 DIAGNOSIS — R0609 Other forms of dyspnea: Secondary | ICD-10-CM

## 2016-08-09 DIAGNOSIS — R072 Precordial pain: Secondary | ICD-10-CM

## 2016-08-09 HISTORY — DX: Localized enlarged lymph nodes: R59.0

## 2016-08-09 LAB — BASIC METABOLIC PANEL
ANION GAP: 8 (ref 5–15)
BUN: 13 mg/dL (ref 6–20)
CALCIUM: 9.2 mg/dL (ref 8.9–10.3)
CO2: 32 mmol/L (ref 22–32)
CREATININE: 1.24 mg/dL (ref 0.61–1.24)
Chloride: 98 mmol/L — ABNORMAL LOW (ref 101–111)
GLUCOSE: 91 mg/dL (ref 65–99)
Potassium: 3.7 mmol/L (ref 3.5–5.1)
Sodium: 138 mmol/L (ref 135–145)

## 2016-08-09 LAB — ECHOCARDIOGRAM COMPLETE
HEIGHTINCHES: 74 in
WEIGHTICAEL: 3032 [oz_av]

## 2016-08-09 MED ORDER — SODIUM CHLORIDE 0.9 % IV SOLN
INTRAVENOUS | Status: DC
  Administered 2016-08-10: 06:00:00 via INTRAVENOUS

## 2016-08-09 MED ORDER — SODIUM CHLORIDE 0.9% FLUSH
3.0000 mL | Freq: Two times a day (BID) | INTRAVENOUS | Status: DC
  Administered 2016-08-09: 3 mL via INTRAVENOUS

## 2016-08-09 MED ORDER — SODIUM CHLORIDE 0.9 % IV SOLN: 250.0000 mL | INTRAVENOUS | Status: DC | PRN

## 2016-08-09 MED ORDER — FUROSEMIDE 40 MG PO TABS
40.0000 mg | ORAL_TABLET | Freq: Every day | ORAL | Status: DC
  Administered 2016-08-11: 40 mg via ORAL
  Filled 2016-08-09: qty 1

## 2016-08-09 MED ORDER — CARVEDILOL 3.125 MG PO TABS
3.1250 mg | ORAL_TABLET | Freq: Two times a day (BID) | ORAL | Status: DC
  Administered 2016-08-09 – 2016-08-10 (×3): 3.125 mg via ORAL
  Filled 2016-08-09 (×3): qty 1

## 2016-08-09 MED ORDER — SODIUM CHLORIDE 0.9% FLUSH: 3.0000 mL | INTRAVENOUS | Status: DC | PRN

## 2016-08-09 NOTE — Consult Note (Signed)
Cardiology Consult    Patient ID: Christian Horton MRN: [id number], DOB/AGE: 1965/06/24   Admit date: 08/07/2016 Date of Consult: 08/09/2016  Primary Physician: Dolores Lory, PA-C Reason for Consult: CHF Primary Cardiologist: Dr. Anne Fu Requesting Provider: Dr. Heide Spark  History of Present Illness    Christian Horton is a 51 y.o. with past medical history significant for hypertension, asthma, and eczema who presented to the [facility] ED for evaluation of shortness of breath. He was found to have systolic and diastolic dysfunction on echo and we have been asked to consult.  [name] has noted progressive DOE with minimal exertion like walking across the room. He denies orthopnea, but has occ PND. PTA he had intermittent edema of the lower extremities up to the knees and of his hands. He had occ cough and wheezing. He also notes occ sharp "needle" like left chest pains that last about a minute and occur about 1-2 times per week, unrelated to activity. His symptoms have greatly improved since admission.   He smoked cigarettes 1/2 PPD from age 40 to 1, none now. Has not had any alcohol since age 1. He does smoke marijuana. He does not know many details of his family history.    He saw Dr. Anne Fu in 01/2015 with atypical chest pain and was noted to have LVH on EKG with non-specific changes. ETT done at that time showed Good exercise capacity, no chest pain, hypertensive BP response to exercise, no ST changes to suggest ischemia. He stopped taking his BP meds about 6 months ago because he does not like to take pills. He now is uninsured.  BNP 1301.7,   POC troponin 0.03,  SCr 1.24,  K+ 3.7 CTA chest: No PE, likely pneumonia. Pleural effusions bilaterally. ?reactive adenopathy vs underlying neoplasm. Mild cardiomegaly and underlying CHF. Reflux of contrast into the inferior vena cava may indicate an increase in right heart pressure CXR: Emphysematous changes in the lungs. New  interstitial infiltrates in the lung bases. New mass or consolidation in the right hilum. CT suggested for further evaluation.   Past Medical History   Past Medical History:  Diagnosis Date  . Allergy   . Asthma     Past Surgical History:  Procedure Laterality Date  . NO PAST SURGERIES       Allergies  Allergies  Allergen Reactions  . Fish Allergy Anaphylaxis  . Peanut-Containing Drug Products Itching  . Penicillins Swelling    Has patient had a PCN reaction causing immediate rash, facial/tongue/throat swelling, SOB or lightheadedness with hypotension: Unknown Has patient had a PCN reaction causing severe rash involving mucus membranes or skin necrosis: Unknown Has patient had a PCN reaction that required hospitalization Unknown Has patient had a PCN reaction occurring within the last 10 years: Unknown If all of the above answers are "NO", then may proceed with Cephalosporin use.     Inpatient Medications    . enoxaparin (LOVENOX) injection  40 mg Subcutaneous Q24H  . [START ON 08/10/2016] furosemide  40 mg Oral Daily  . hydrocerin   Topical BID  . lisinopril  20 mg Oral Daily  . mometasone-formoterol  2 puff Inhalation BID    Family History    Family History  Problem Relation Age of Onset  . Asthma Mother   . Stroke Father   . Cancer Father   . Asthma Sister   . Obesity Sister     Social History    Social History   Social History  .  Marital status: Single    Spouse name: N/A  . Number of children: N/A  . Years of education: N/A   Occupational History  . Not on file.   Social History Main Topics  . Smoking status: Former Smoker    Packs/day: 0.50    Years: 17.00    Types: Cigarettes    Quit date: 1995  . Smokeless tobacco: Never Used  . Alcohol use No  . Drug use: Yes    Types: Marijuana  . Sexual activity: Not on file   Other Topics Concern  . Not on file   Social History Narrative  . No narrative on file     Review of Systems    General:  No chills, fever, night sweats or weight changes.  Cardiovascular:  Positive for intermittent Sharp needle like chest pain as abovepain, dyspnea on exertion, edema,  paroxysmal nocturnal dyspnea No orthopnea, palpitations, Dermatological: Excema Respiratory: Occ cough and wheezing Urologic: No hematuria, dysuria Abdominal:   No nausea, vomiting, diarrhea, bright red blood per rectum, melena, or hematemesis Neurologic:  No visual changes, wkns, changes in mental status. All other systems reviewed and are otherwise negative except as noted above.  Physical Exam   Blood pressure 115/69, pulse 89, temperature 98.6 F (37 C), temperature source Oral, resp. rate 18, height 6\' 2"  (1.88 m), weight 176 lb 1.6 oz (79.9 kg), SpO2 100 %.  General: Pleasant, NAD Psych: Normal affect. Neuro: Alert and oriented X 3. Moves all extremities spontaneously. HEENT: Normal  Neck: Supple without bruits or JVD. Lungs:  Resp regular and unlabored, CTA. Heart: RRR no s3, s4, or murmurs. Abdomen: Soft, non-tender, non-distended, BS + x 4.  Extremities: No clubbing, cyanosis or edema. DP/PT/Radials 2+ and equal bilaterally. Skin extremely dry  Labs    Troponin (Point of Care Test)  Recent Labs  08/07/16 0924  TROPIPOC 0.03   No results for input(s): CKTOTAL, CKMB, TROPONINI in the last 72 hours. Lab Results  Component Value Date   WBC 5.7 08/07/2016   HGB 13.8 08/07/2016   HCT 42.0 08/07/2016   MCV 78.8 08/07/2016   PLT 197 08/07/2016     Recent Labs Lab 08/09/16 1024  NA 138  K 3.7  CL 98*  CO2 32  BUN 13  CREATININE 1.24  CALCIUM 9.2  GLUCOSE 91   No results found for: CHOL, HDL, LDLCALC, TRIG No results found for: [facility]   Radiology Studies    Dg Chest 2 View  Result Date: 08/07/2016 CLINICAL DATA:  Difficulty breathing. Shortness of breath. History of asthma. EXAM: CHEST  2 VIEW COMPARISON:  07/06/2015 FINDINGS: Normal heart size and pulmonary vascularity. Diffuse  emphysematous changes in the lungs. Interstitial infiltrates in the lung bases are new since previous study suggesting edema or interstitial pneumonia. There is developing right perihilar opacity which could represent perihilar consolidation or mass lesion. CT suggested for further evaluation. No blunting of costophrenic angles. No pneumothorax. IMPRESSION: Emphysematous changes in the lungs. New interstitial infiltrates in the lung bases. New mass or consolidation in the right hilum. CT suggested for further evaluation. Electronically Signed   By: Burman Nieves M.D.   On: 08/07/2016 06:21   Ct Angio Chest Pe W Or Wo Contrast  Result Date: 08/07/2016 CLINICAL DATA:  Shortness of breath with questionable mass lesion on chest radiograph EXAM: CT ANGIOGRAPHY CHEST WITH CONTRAST TECHNIQUE: Multidetector CT imaging of the chest was performed using the standard protocol during bolus administration of intravenous contrast. Multiplanar CT image  reconstructions and MIPs were obtained to evaluate the vascular anatomy. CONTRAST:  100 mL Isovue 370 nonionic COMPARISON:  Chest radiograph August 07, 2016 FINDINGS: Cardiovascular: There is no demonstrable pulmonary embolus. There is no thoracic aortic aneurysm. While no evidence of dissection is seen. The bolus of contrast is not optimal for assessment for potential thoracic aortic dissection. Visualized great vessels appear normal. Pericardium is not appreciably thickened. Heart overall is slightly enlarged. Mediastinum/Nodes: Thyroid appears unremarkable. There is adenopathy in the right hilar region. There is a focal lymph node in the right hilum measuring 2.4 x 2.0 cm. Several smaller hilar lymph nodes are noted on the right. There is adenopathy in the sub- carinal region. In the right inferior sub- carinal region, there is a lymph node measuring 2.6 x 2.2 cm. More superiorly in the right sub- carinal region, there is a lymph node measuring 2.2 x 1.7 cm. There are  scattered smaller lymph nodes throughout the mediastinum. Lungs/Pleura: There are fairly small a present pleural effusions bilaterally. There is airspace consolidation in the posterior segment right upper lobe which extends to the superior right hilum. There is more patchy infiltrate in the superior segment right lower lobe near the right hilum. No parenchymal lung mass lesion is evident. There is scarring in the lung bases with a degree of lower lobe bronchiectatic change bilaterally. There is slight bibasilar interstitial edema. Upper Abdomen: In the visualized upper abdomen, there is reflux of contrast into the inferior vena cava and minimally into the hepatic veins. Visualized upper abdominal structures otherwise appear unremarkable. Musculoskeletal: There are no blastic or lytic bone lesions. No chest wall lesions are evident. Review of the MIP images confirms the above findings. IMPRESSION: No demonstrable pulmonary embolus. Contrast bolus limited for assessment for potential thoracic dissection. No thoracic aortic aneurysm or thoracic aortic mucosal lesion evident on this study. Airspace consolidation in the right upper lobe posterior segment and to a lesser extent superior segment right lower lobe, likely due to pneumonia. Pleural effusions noted bilaterally. Areas of adenopathy of uncertain etiology. Given the pneumonia, this adenopathy could be reactive. Underlying neoplasm is possible given this circumstance. There are pleural effusions bilaterally with mild cardiomegaly. There is mild bibasilar interstitial edema. There is felt to be a degree of underlying congestive heart failure. There is mild lower lobe bronchiectatic change bilaterally. Reflux of contrast into the inferior vena cava may indicate an increase in right heart pressure. Electronically Signed   By: Bretta Bang III M.D.   On: 08/07/2016 08:55    EKG & Cardiac Imaging   EKG: Sinus rhythm 99 bpm, LVH pattern, TWI anteroseptally,  non-specific ST/T changes laterally, possible LAE, left anterior fascicular block. QTC 503. New findings when compared to 01/2015 except LVH was present to lesser degree in 2016.   Echocardiogram:   08/08/16 Study Conclusions  - Left ventricle: The cavity size was normal. Wall thickness was   increased in a pattern of mild LVH. The estimated ejection   fraction was 35%. Diffuse hypokinesis. Features are consistent   with a pseudonormal left ventricular filling pattern, with   concomitant abnormal relaxation and increased filling pressure   (grade 2 diastolic dysfunction). - Aortic valve: There was no stenosis. - Mitral valve: There was moderate regurgitation. - Left atrium: The atrium was moderately dilated. - Right ventricle: The cavity size was normal. Systolic function   was normal. - Right atrium: The atrium was mildly dilated. - Pulmonary arteries: No complete TR doppler jet so unable to  estimate PA systolic pressure. - Inferior vena cava: The vessel was normal in size. The   respirophasic diameter changes were in the normal range (>= 50%),   consistent with normal central venous pressure.  Impressions:  - Normal LV size with mild LV hypertrophy. EF 35%, diffuse   hypokinesis. Moderate diastolic dysfunction. Normal RV size and   systolic function. Moderate MR. Biatrial enlargement.  Assessment & Plan    Acute systolic and diastolic heart failure -Progressive DOE with walking across the room. Atypical intermittent sharp chest pain. -EF 35% by echo 08/08/16. Mild LVH, diffuse hypokinesis, increased filling pressures, Grade 2 DD, mod MR -BNP 1301.7, new changes on EKG since 2016 -Diuresed with lasix 40 mg IV q 12h with weight down 4 lbs, Net negative 3.4L I&O. Now transitioned to oral lasix 40 mg daily starting tomorrow. Breathing and edema have greatly improved. -Started on ACE-I, lisinopril 20 mg daily. Consider addition of carvedilol (HR is 80's-100) and home diuretic,  lasix. -Kidney function stable  -Heart failure is likely related to untreated hypertension, but with EKG changes, DOE and ongoing atypical chest pain coronary artery disease may play a role. -Will discuss need for further cardiac evaluation with Dr. Clifton James- Plan for cardiac cath tomorrow. -Will need ongoing cariology follow up after discharge to optimize medical regimen -Low sodium diet- discussed with patient -Pt unable to take aspirin causes itching and rash.  Hypertension -BP high on admission, better this morning on lisinopril- 115/69.  -Pt had stopped taking his BP med 6 months ago because he does not like taking pills. Discussed the role pf hypertension in heart disease and importance of medication compliance and follow up care. He verbalizes understanding and states that he will comply.   Oletta Cohn, NP-C 08/09/2016, 4:55 PM Pager: [phone]  I have personally seen and examined this patient with Berton Bon, NP-C. I agree with the assessment and plan as outlined above. He has no prior documented cardiac disease. He is admitted with volume overload and has been diuresed with IV Lasix. He has had improvement in his symptoms. Echo today with LVEF=35% with global HK, moderate MR. He has left sided sharp chest pain several times per month. He has had LE edema for the last several months.  My exam shows a WDWN male in NAD. CV:RRR with no murmurs. LUngs: clear bilaterally. Abd: NT, ND, soft, BS present. Ext: no edema. Skin thickening noted on arms and legs.  Labs reviewed by me. Echo images reviewed by me. EKG is reviewed by me today and shows sinus rhythm with LVH, diffuse T wave abnormalities, new.  Plan: 1. Acute systolic CHF/cardiomyopathy/chest pain/EKG abnormalities: He has episodes of chest pain that  Are atypical but his EKG changes are new. Echo with global HK. This may be from longstanding HTN with non-compliance with BP meds.  I have reviewed options for ischemic  workup including stress testing vs cath. He wishes to proceed with a cardiac cath to exclude obstructive CAD.  Will plan cath tomorrow. Risks of procedure reviewed with pt. Add Coreg Continue Ace-inh. Continue oral Lasix.   Verne Carrow 08/09/2016 5:17 PM

## 2016-08-09 NOTE — Progress Notes (Signed)
Subjective: Reports feeling fine this morning, diuresing well. Continues to report mild intermittent dyspnea and states his chest feels tight. Was not aware of PRN Duonebs he had available, advised to try this.   Net -3 lbs and -3.4L for last 24 hours Telemetry reviewed and continues to show intermittent sinus tach and PVCs  Objective: Vital signs in last 24 hours: Vitals:   08/08/16 1045 08/08/16 1937 08/08/16 1954 08/09/16 0546  BP:  (!) 133/98  (!) 120/93  Pulse:  100  94  Resp:  18  18  Temp:  98.5 F (36.9 C)  98.1 F (36.7 C)  TempSrc:  Oral  Oral  SpO2: 98% 100% 96% 100%  Weight:    176 lb 1.6 oz (79.9 kg)  Height:        Intake/Output Summary (Last 24 hours) at 08/09/16 1610 Last data filed at 08/09/16 0500  Gross per 24 hour  Intake              480 ml  Output             3900 ml  Net            -3420 ml   Filed Weights   08/07/16 0543 08/08/16 0341 08/09/16 0546  Weight: 180 lb (81.6 kg) 179 lb 8 oz (86 kg) 176 lb 1.6 oz (79.9 kg)   Physical Exam General appearance: [name] resting comfortably in bed, diffuse dry/rough appearing skin, conversational HENT: Normocephalic, atraumatic, moist MMs Cardiovascular: Regular rate and rhythm, no murmurs, rubs, gallops Respiratory/Chest: Clear to ausculation bilaterally, normal work of breathing Abdomen: Soft, non-tender, non-distended Skin: Warm, dry, diffuse coarse appearing skin Ext: Well perfused, trace pitting edema of BLEs Psych: Appropriate affect  Labs / Imaging / Procedures: CBC Latest Ref Rng & Units 08/07/2016 07/06/2015 01/21/2015  WBC 4.0 - 10.5 K/uL 5.7 3.1(L) 4.0(A)  Hemoglobin 13.0 - 17.0 g/dL 96.0 45.4 13.6(A)  Hematocrit 39.0 - 52.0 % 42.0 43.9 42.1(A)  Platelets 150 - 400 K/uL 197 233 -   BMP Latest Ref Rng & Units 08/08/2016 08/07/2016 07/06/2015  Glucose 65 - 99 mg/dL 89 84 86  BUN 6 - 20 mg/dL 15 13 16   Creatinine 0.61 - 1.24 mg/dL 0.98 1.19 1.47(W)  Sodium 135 - 145 mmol/L 139  142 135  Potassium 3.5 - 5.1 mmol/L 3.7 3.8 3.7  Chloride 101 - 111 mmol/L 100(L) 104 98(L)  CO2 22 - 32 mmol/L 31 31 26   Calcium 8.9 - 10.3 mg/dL 8.9 2.9(F) 8.9   No results found.  Assessment/Plan: [name] is a 51 y.o. man with PMH asthma anduntreated HTN admitted for dyspnea and presumed CHF.   Principal Problem:   Dyspnea Active Problems:   Essential hypertension   Mild intermittent asthma with exacerbation   Congestive heart failure (HCC)  Presumed CHF, presenting with dyspnea and exercise intolerance, imaging significant for cardiomegaly, pleural effusions, and bibasilar interstitial edema. History of untreated HTN and prominent LVH apparent on EKG. Symptoms improving with diuresis. Dry weight likely 173-175 lbs. -- Transition to lasix 40 mg po QD -- Strict I/Os and daily weights -- Follow TTE and plan on cards consult -- Telemetry  RUL consolidation, incidental finding on CT, no symptoms of pneumonia or evidence of infection illustrated in lab findings, infectious vs inflammatory vs asymmetric edema -- F/up with CT in 6 weeks  Asthma, No PFTs on file. -- Duonebs Q6H prn -- Dulera BID  Hypertension -- Lisinopril 20 mg daily  Eczema -- hydrocerin mosturizer twice daily  Dispo: Anticipated discharge in approximately 0-1 day(s).    LOS: 1 day   Althia Forts, MD 08/09/2016, 7:04 AM Pager: [phone]

## 2016-08-09 NOTE — Discharge Summary (Signed)
Name: Christian Horton MRN: [id number] DOB: 07/06/1965 51 y.o. PCP: Ofilia Neas, PA-C  Date of Admission: 08/07/2016  5:29 AM Date of Discharge: 08/11/2016 Attending Physician: Earl Lagos, MD  Discharge Diagnosis: 1. Acute systolic congestive heart failure 2. Essential hypertension 3. Right upper lobe consolidation and adenopathy 4. Constipation  Principal Problem:   Acute systolic CHF (congestive heart failure), NYHA class 3 (HCC) Active Problems:   Essential hypertension   Dyspnea   Mild intermittent asthma with exacerbation   Congestive heart failure (HCC)   Hilar adenopathy   Precordial pain   Dilated cardiomyopathy (HCC)   CAD (coronary artery disease)   Constipation   Hyperlipidemia   Discharge Medications: Allergies as of 08/11/2016      Reactions   Fish Allergy Anaphylaxis   Aspirin Itching, Rash   Peanut-containing Drug Products Itching   Penicillins Swelling   Has patient had a PCN reaction causing immediate rash, facial/tongue/throat swelling, SOB or lightheadedness with hypotension: Unknown Has patient had a PCN reaction causing severe rash involving mucus membranes or skin necrosis: Unknown Has patient had a PCN reaction that required hospitalization Unknown Has patient had a PCN reaction occurring within the last 10 years: Unknown If all of the above answers are "NO", then may proceed with Cephalosporin use.   Powder    Powder in Latex gloves      Medication List    TAKE these medications   albuterol 108 (90 Base) MCG/ACT inhaler Commonly known as:  PROVENTIL HFA;VENTOLIN HFA Inhale 2 puffs into the lungs every 4 (four) hours as needed for wheezing or shortness of breath (cough, shortness of breath or wheezing.).   atorvastatin 40 MG tablet Commonly known as:  LIPITOR Take 1 tablet (40 mg total) by mouth daily at 6 PM.   carvedilol 6.25 MG tablet Commonly known as:  COREG Take 1 tablet (6.25 mg total) by mouth 2 (two) times daily with  a meal.   cetirizine 10 MG tablet Commonly known as:  ZYRTEC Take 1 tablet (10 mg total) by mouth daily. What changed:  when to take this  reasons to take this   clopidogrel 75 MG tablet Commonly known as:  PLAVIX Take 1 tablet (75 mg total) by mouth daily.   eucerin cream Apply topically as needed for dry skin.   furosemide 40 MG tablet Commonly known as:  LASIX Take 1 tablet (40 mg total) by mouth daily.   lisinopril 20 MG tablet Commonly known as:  PRINIVIL,ZESTRIL Take 1 tablet (20 mg total) by mouth daily.   mometasone-formoterol 200-5 MCG/ACT Aero Commonly known as:  DULERA INHALE 2 PUFFS INTO THE LUNGS 2 TIMES DAILY. What changed:  how much to take  how to take this  when to take this  reasons to take this  additional instructions   polyethylene glycol packet Commonly known as:  MIRALAX / GLYCOLAX Take 17 g by mouth daily.   senna-docusate 8.6-50 MG tablet Commonly known as:  Senokot-S Take 1 tablet by mouth at bedtime as needed for mild constipation.   triamcinolone ointment 0.1 % Commonly known as:  KENALOG Apply 1 application topically 2 (two) times daily. What changed:  when to take this  reasons to take this       Disposition and follow-up:   ChristianChristian Horton was discharged from [facility] in Stable condition.  At the hospital follow up visit please address:  1. Acute systolic congestive heart failure - assess for CHF symptoms, adherence with Lasix,  Carvedilol, Lisinopril, Atorvastatin, assess weight, left hospital at 178 lb, assess fluid and salt intake  Essential hypertension - assess blood pressure control, check renal function and electrolytes after starting above medications  Right upper lobe consolidation and adenopathy - assess for cough, dyspnea, obtain CT lung in 6 weeks to assess for worsening, resolution  Constipation - assess BM frequency and need for laxatives, assess diet  2.  Labs / imaging needed  at time of follow-up: BMP, CBC, CT lung to follow up RUL infiltrate and adenopathy  3.  Pending labs/ test needing follow-up: None  Follow-up Appointments: Follow-up Information    Donato Schultz, MD Follow up.   Specialty:  Cardiology Why:  our office will call you for a hospital follow-up visit  Contact information: [address] [place] [address] [phone]        [facility] Follow up on 08/16/2016.   Why:  4 pm for hospital follow up Contact information: [place] [address] [phone]          Hospital Course by problem list: Principal Problem:   Acute systolic CHF (congestive heart failure), NYHA class 3 (HCC) Active Problems:   Essential hypertension   Dyspnea   Mild intermittent asthma with exacerbation   Congestive heart failure (HCC)   Hilar adenopathy   Precordial pain   Dilated cardiomyopathy (HCC)   CAD (coronary artery disease)   Constipation   Hyperlipidemia   1. Acute systolic congestive heart failure  Christian Horton is a 51 year old man with hypertension, asthma, eczema who presented to the ED on 3/19 with dyspnea on exertion, orthopnea, and intermittent sharp chest pain. His symptoms were not relieved with albuterol rescue inhaler. He had BLE edema on exam and BNP elevated to 1300, CT chest revealed bilateral pleural effusions, cardiomegaly. Overall presentation clinically suggestive of acute CHF. He was started on IV Lasix and achieved adequate diuresis over the ensuing days with improved dyspnea and edema. TTE on 3/20 revealed LVEF 35% with global hypokinesis. Cardiology was consulted and proceeded to left heart cath and angiography on 3/22 which revealed non-obstructive CAD with stenoses of LAD, RCA, and first diagonal. During hospitalization Lisinopril 20 mg, Carvedilol 6.25 mg BID, Atorvastatin 40 mg, and Plavix 75 mg daily were started for medical  optimization of his CHF/CAD. He was provided prescriptions for these and Lasix 40 mg daily and deemed stable for discharge on 3/23 with PCP follow up.   Essential hypertension - presented with acute CHF and reported noncompliance with previously-prescribed antihypertensive for several months. He was started on Lisinopril 20 mg daily, and Carvedilol 6.25 mg BID once diagnosis of CHF was established and maintained controlled blood pressures without hypotension or dizziness.   Right upper lobe consolidation and adenopathy - during workup for dyspnea and chest pain CTA chest was obtained which revealed airspace consolidation in the right upper lobe posterior segment concerning for pneumonia and areas of adenopathy of uncertain etiology. The patient had normal procalcitonin, no cough, fevers, leukocytosis, or crackles on lung exam and so was not given antibiotics. He reported no recent N/V, potential aspiration events, or heavy drinking. His exam and clinical history were more consistent with CHF and pulmonary edema.   Constipation - reported having no BM for several days during his hospital stay. We provided Senna-S and Miralax medications during his stay and prescriptions on discharge.   Discharge Vitals:   BP 124/86 (BP Location: Left Arm)  Pulse 77   Temp 97.4 F (36.3 C) (Oral)   Resp 14   Ht 6\' 2"  (1.88 m)   Wt 178 lb 9.2 oz (81 kg)   SpO2 96%   BMI 22.93 kg/m   Pertinent Labs, Studies, and Procedures:  BMP Latest Ref Rng & Units 08/11/2016 08/10/2016 08/09/2016  Glucose 65 - 99 mg/dL 83 87 91  BUN 6 - 20 mg/dL 40(J) 15 13  Creatinine 0.61 - 1.24 mg/dL 8.11(B) 1.47 8.29  Sodium 135 - 145 mmol/L 137 137 138  Potassium 3.5 - 5.1 mmol/L 3.9 3.9 3.7  Chloride 101 - 111 mmol/L 99(L) 99(L) 98(L)  CO2 22 - 32 mmol/L 30 28 32  Calcium 8.9 - 10.3 mg/dL 8.9 9.1 9.2    Ref. Range 08/07/2016 09:13  B Natriuretic Peptide Latest Ref Range: 0.0 - 100.0 pg/mL 1,301.7 (H)   Lipid Panel       Component Value Date/Time   CHOL 236 (H) 08/10/2016 0434   TRIG 129 08/10/2016 0434   HDL 74 08/10/2016 0434   CHOLHDL 3.2 08/10/2016 0434   VLDL 26 08/10/2016 0434   LDLCALC 136 (H) 08/10/2016 0434   Drugs of Abuse     Component Value Date/Time   LABOPIA NONE DETECTED 08/10/2016 0639   COCAINSCRNUR NONE DETECTED 08/10/2016 0639   LABBENZ NONE DETECTED 08/10/2016 0639   AMPHETMU NONE DETECTED 08/10/2016 0639   THCU NONE DETECTED 08/10/2016 0639   LABBARB NONE DETECTED 08/10/2016 0639    3/20 TTE Study Conclusions - Left ventricle: The cavity size was normal. Wall thickness was   increased in a pattern of mild LVH. The estimated ejection   fraction was 35%. Diffuse hypokinesis. Features are consistent   with a pseudonormal left ventricular filling pattern, with   concomitant abnormal relaxation and increased filling pressure   (grade 2 diastolic dysfunction). - Aortic valve: There was no stenosis. - Mitral valve: There was moderate regurgitation. - Left atrium: The atrium was moderately dilated. - Right ventricle: The cavity size was normal. Systolic function   was normal. - Right atrium: The atrium was mildly dilated. - Pulmonary arteries: No complete TR doppler jet so unable to   estimate PA systolic pressure. - Inferior vena cava: The vessel was normal in size. The   respirophasic diameter changes were in the normal range (>= 50%), consistent with normal central venous pressure. Impressions: - Normal LV size with mild LV hypertrophy. EF 35%, diffuse   hypokinesis. Moderate diastolic dysfunction. Normal RV size and systolic function. Moderate MR. Biatrial enlargement.  3/22 - LHC and coronary angiography Conclusions: 1. Non-obstructive coronary artery disease, including 70% stenoses of the apical LAD and proximal/mid RCA as well as 60% disease involving medium-caliber first diagonal branch. Proximal/mid RCA lesion is not hemodynamically significant (FFR 0.86). 2. Normal  left ventricular filling pressure. Recommendations: 1. Medical therapy of non-ischemic cardiomyopathy (LV dysfunction is out of proportion to coronary artery disease) and non-obstructive coronary artery disease.  Dg Chest 2 View  Result Date: 08/07/2016 CLINICAL DATA:  Difficulty breathing. Shortness of breath. History of asthma. EXAM: CHEST  2 VIEW COMPARISON:  07/06/2015 FINDINGS: Normal heart size and pulmonary vascularity. Diffuse emphysematous changes in the lungs. Interstitial infiltrates in the lung bases are new since previous study suggesting edema or interstitial pneumonia. There is developing right perihilar opacity which could represent perihilar consolidation or mass lesion. CT suggested for further evaluation. No blunting of costophrenic angles. No pneumothorax. IMPRESSION: Emphysematous changes in the lungs. New interstitial infiltrates in  the lung bases. New mass or consolidation in the right hilum. CT suggested for further evaluation. Electronically Signed   By: Burman Nieves M.D.   On: 08/07/2016 06:21   Ct Angio Chest Pe W Or Wo Contrast  Result Date: 08/07/2016 CLINICAL DATA:  Shortness of breath with questionable mass lesion on chest radiograph EXAM: CT ANGIOGRAPHY CHEST WITH CONTRAST TECHNIQUE: Multidetector CT imaging of the chest was performed using the standard protocol during bolus administration of intravenous contrast. Multiplanar CT image reconstructions and MIPs were obtained to evaluate the vascular anatomy. CONTRAST:  100 mL Isovue 370 nonionic COMPARISON:  Chest radiograph August 07, 2016 FINDINGS: Cardiovascular: There is no demonstrable pulmonary embolus. There is no thoracic aortic aneurysm. While no evidence of dissection is seen. The bolus of contrast is not optimal for assessment for potential thoracic aortic dissection. Visualized great vessels appear normal. Pericardium is not appreciably thickened. Heart overall is slightly enlarged. Mediastinum/Nodes: Thyroid  appears unremarkable. There is adenopathy in the right hilar region. There is a focal lymph node in the right hilum measuring 2.4 x 2.0 cm. Several smaller hilar lymph nodes are noted on the right. There is adenopathy in the sub- carinal region. In the right inferior sub- carinal region, there is a lymph node measuring 2.6 x 2.2 cm. More superiorly in the right sub- carinal region, there is a lymph node measuring 2.2 x 1.7 cm. There are scattered smaller lymph nodes throughout the mediastinum. Lungs/Pleura: There are fairly small a present pleural effusions bilaterally. There is airspace consolidation in the posterior segment right upper lobe which extends to the superior right hilum. There is more patchy infiltrate in the superior segment right lower lobe near the right hilum. No parenchymal lung mass lesion is evident. There is scarring in the lung bases with a degree of lower lobe bronchiectatic change bilaterally. There is slight bibasilar interstitial edema. Upper Abdomen: In the visualized upper abdomen, there is reflux of contrast into the inferior vena cava and minimally into the hepatic veins. Visualized upper abdominal structures otherwise appear unremarkable. Musculoskeletal: There are no blastic or lytic bone lesions. No chest wall lesions are evident. Review of the MIP images confirms the above findings. IMPRESSION: No demonstrable pulmonary embolus. Contrast bolus limited for assessment for potential thoracic dissection. No thoracic aortic aneurysm or thoracic aortic mucosal lesion evident on this study. Airspace consolidation in the right upper lobe posterior segment and to a lesser extent superior segment right lower lobe, likely due to pneumonia. Pleural effusions noted bilaterally. Areas of adenopathy of uncertain etiology. Given the pneumonia, this adenopathy could be reactive. Underlying neoplasm is possible given this circumstance. There are pleural effusions bilaterally with mild cardiomegaly.  There is mild bibasilar interstitial edema. There is felt to be a degree of underlying congestive heart failure. There is mild lower lobe bronchiectatic change bilaterally. Reflux of contrast into the inferior vena cava may indicate an increase in right heart pressure. Electronically Signed   By: Bretta Bang III M.D.   On: 08/07/2016 08:55    Discharge Instructions: Discharge Instructions    (HEART FAILURE PATIENTS) Call MD:  Anytime you have any of the following symptoms: 1) 3 pound weight gain in 24 hours or 5 pounds in 1 week 2) shortness of breath, with or without a dry hacking cough 3) swelling in the hands, feet or stomach 4) if you have to sleep on extra pillows at night in order to breathe.    Complete by:  As directed  Call MD for:  difficulty breathing, headache or visual disturbances    Complete by:  As directed    Diet - low sodium heart healthy    Complete by:  As directed    Discharge instructions    Complete by:  As directed    We are glad that we have been able to improve your breathing and chest discomfort during this hospital stay. We have diagnosed you with moderate heart failure that we believe is due to uncontrolled high blood pressure. This means that your body will tend to hold onto fluid and salt, causing swelling in your legs and fluid build up your lungs. We have also found that you have some coronary artery disease.  For the heart failure we have started you on Lasix 40 mg once daily, a "fluid pill" To keep your heart failure from worsening and to control blood pressure we have started you on Lisinopril 20 mg daily, and Carvedilol (Coreg) 6.25 mg twice daily.  For your coronary artery disease we have started Plavix 75 mg daily, a blood thinner, and Atorvastatin (Lipitor) 40 mg to lower cholesterol.  For your constipation we have provided a short prescription for Miralax and Senna-Docusate, both of which you can take twice daily until you have a bowel  movement.  Please pick up your medications from the [facility] pharmacy, they can help you to afford your medications.   Increase activity slowly    Complete by:  As directed       Signed: Althia Forts, MD 08/13/2016, 9:15 PM   Pager: [phone]

## 2016-08-10 ENCOUNTER — Encounter (HOSPITAL_COMMUNITY)
Admission: EM | Disposition: A | Discharge status: Home / Self Care | Payer: Self-pay | Source: Home / Self Care | Attending: Internal Medicine

## 2016-08-10 DIAGNOSIS — I110 Hypertensive heart disease with heart failure: Principal | ICD-10-CM

## 2016-08-10 DIAGNOSIS — E785 Hyperlipidemia, unspecified: Secondary | ICD-10-CM

## 2016-08-10 HISTORY — PX: LEFT HEART CATH AND CORONARY ANGIOGRAPHY: CATH118249

## 2016-08-10 HISTORY — PX: INTRAVASCULAR PRESSURE WIRE/FFR STUDY: CATH118243

## 2016-08-10 LAB — BASIC METABOLIC PANEL
ANION GAP: 10 (ref 5–15)
BUN: 15 mg/dL (ref 6–20)
CALCIUM: 9.1 mg/dL (ref 8.9–10.3)
CO2: 28 mmol/L (ref 22–32)
Chloride: 99 mmol/L — ABNORMAL LOW (ref 101–111)
Creatinine, Ser: 1.16 mg/dL (ref 0.61–1.24)
GLUCOSE: 87 mg/dL (ref 65–99)
Potassium: 3.9 mmol/L (ref 3.5–5.1)
SODIUM: 137 mmol/L (ref 135–145)

## 2016-08-10 LAB — RAPID URINE DRUG SCREEN, HOSP PERFORMED
AMPHETAMINES: NOT DETECTED
BENZODIAZEPINES: NOT DETECTED
Barbiturates: NOT DETECTED
Cocaine: NOT DETECTED
Opiates: NOT DETECTED
Tetrahydrocannabinol: NOT DETECTED

## 2016-08-10 LAB — PROCALCITONIN

## 2016-08-10 LAB — CBC
HEMATOCRIT: 46.5 % (ref 39.0–52.0)
Hemoglobin: 15.6 g/dL (ref 13.0–17.0)
MCH: 26.2 pg (ref 26.0–34.0)
MCHC: 33.5 g/dL (ref 30.0–36.0)
MCV: 78.2 fL (ref 78.0–100.0)
PLATELETS: 224 10*3/uL (ref 150–400)
RBC: 5.95 MIL/uL — ABNORMAL HIGH (ref 4.22–5.81)
RDW: 14.9 % (ref 11.5–15.5)
WBC: 5.4 10*3/uL (ref 4.0–10.5)

## 2016-08-10 LAB — LIPID PANEL
CHOLESTEROL: 236 mg/dL — AB (ref 0–200)
HDL: 74 mg/dL (ref >40)
LDL CALC: 136 mg/dL — AB (ref 0–99)
TRIGLYCERIDES: 129 mg/dL (ref <150)
Total CHOL/HDL Ratio: 3.2 RATIO
VLDL: 26 mg/dL (ref 0–40)

## 2016-08-10 LAB — PROTIME-INR
INR: 0.99
PROTHROMBIN TIME: 13.1 s (ref 11.4–15.2)

## 2016-08-10 LAB — MAGNESIUM: Magnesium: 2 mg/dL (ref 1.7–2.4)

## 2016-08-10 LAB — POCT ACTIVATED CLOTTING TIME: Activated Clotting Time: 241 seconds

## 2016-08-10 SURGERY — LEFT HEART CATH AND CORONARY ANGIOGRAPHY
Anesthesia: LOCAL

## 2016-08-10 MED ORDER — ADENOSINE (DIAGNOSTIC) 140MCG/KG/MIN
INTRAVENOUS | Status: DC | PRN
  Administered 2016-08-10: 140 ug/kg/min via INTRAVENOUS

## 2016-08-10 MED ORDER — LIDOCAINE HCL (PF) 1 % IJ SOLN
INTRAMUSCULAR | Status: AC
  Filled 2016-08-10: qty 30

## 2016-08-10 MED ORDER — POLYETHYLENE GLYCOL 3350 17 G PO PACK: 17.0000 g | PACK | Freq: Every day | ORAL | Status: DC | PRN

## 2016-08-10 MED ORDER — HEPARIN SODIUM (PORCINE) 1000 UNIT/ML IJ SOLN
INTRAMUSCULAR | Status: AC
  Filled 2016-08-10: qty 1

## 2016-08-10 MED ORDER — SODIUM CHLORIDE 0.9% FLUSH
3.0000 mL | Freq: Two times a day (BID) | INTRAVENOUS | Status: DC
  Administered 2016-08-10 – 2016-08-11 (×2): 3 mL via INTRAVENOUS

## 2016-08-10 MED ORDER — MIDAZOLAM HCL 2 MG/2ML IJ SOLN
INTRAMUSCULAR | Status: DC | PRN
  Administered 2016-08-10: 1 mg via INTRAVENOUS

## 2016-08-10 MED ORDER — FENTANYL CITRATE (PF) 100 MCG/2ML IJ SOLN
INTRAMUSCULAR | Status: DC | PRN
  Administered 2016-08-10: 50 ug via INTRAVENOUS

## 2016-08-10 MED ORDER — FENTANYL CITRATE (PF) 100 MCG/2ML IJ SOLN
INTRAMUSCULAR | Status: AC
  Filled 2016-08-10: qty 2

## 2016-08-10 MED ORDER — ASPIRIN 81 MG PO CHEW: 81.0000 mg | CHEWABLE_TABLET | Freq: Once | ORAL | Status: DC

## 2016-08-10 MED ORDER — ENOXAPARIN SODIUM 40 MG/0.4ML ~~LOC~~ SOLN
40.0000 mg | SUBCUTANEOUS | Status: DC
  Administered 2016-08-11: 08:00:00 40 mg via SUBCUTANEOUS
  Filled 2016-08-10: qty 0.4

## 2016-08-10 MED ORDER — SODIUM CHLORIDE 0.9 % IV SOLN: 250.0000 mL | INTRAVENOUS | Status: DC | PRN

## 2016-08-10 MED ORDER — IOPAMIDOL (ISOVUE-370) INJECTION 76%
INTRAVENOUS | Status: DC | PRN
  Administered 2016-08-10: 60 mL via INTRA_ARTERIAL

## 2016-08-10 MED ORDER — HEPARIN SODIUM (PORCINE) 1000 UNIT/ML IJ SOLN
INTRAMUSCULAR | Status: DC | PRN
  Administered 2016-08-10: 3000 [IU] via INTRAVENOUS
  Administered 2016-08-10 (×2): 4000 [IU] via INTRAVENOUS

## 2016-08-10 MED ORDER — SENNOSIDES-DOCUSATE SODIUM 8.6-50 MG PO TABS
1.0000 | ORAL_TABLET | Freq: Two times a day (BID) | ORAL | Status: DC
  Administered 2016-08-10 – 2016-08-11 (×2): 1 via ORAL
  Filled 2016-08-10 (×2): qty 1

## 2016-08-10 MED ORDER — ADENOSINE 12 MG/4ML IV SOLN
INTRAVENOUS | Status: AC
Start: 2016-08-10 — End: 2016-08-10
  Filled 2016-08-10: qty 4

## 2016-08-10 MED ORDER — HEPARIN SODIUM (PORCINE) 1000 UNIT/ML IJ SOLN
INTRAMUSCULAR | Status: AC
Start: 2016-08-10 — End: 2016-08-10
  Filled 2016-08-10: qty 1

## 2016-08-10 MED ORDER — ASPIRIN 81 MG PO CHEW
81.0000 mg | CHEWABLE_TABLET | Freq: Once | ORAL | Status: DC
  Filled 2016-08-10: qty 1

## 2016-08-10 MED ORDER — LIDOCAINE HCL (PF) 1 % IJ SOLN
INTRAMUSCULAR | Status: DC | PRN
  Administered 2016-08-10: 2 mL

## 2016-08-10 MED ORDER — SODIUM CHLORIDE 0.9% FLUSH: 3.0000 mL | INTRAVENOUS | Status: DC | PRN

## 2016-08-10 MED ORDER — MIDAZOLAM HCL 2 MG/2ML IJ SOLN
INTRAMUSCULAR | Status: AC
  Filled 2016-08-10: qty 2

## 2016-08-10 MED ORDER — HEPARIN (PORCINE) IN NACL 2-0.9 UNIT/ML-% IJ SOLN
INTRAMUSCULAR | Status: AC
Start: 2016-08-10 — End: 2016-08-10
  Filled 2016-08-10: qty 1000

## 2016-08-10 MED ORDER — HEPARIN (PORCINE) IN NACL 2-0.9 UNIT/ML-% IJ SOLN
INTRAMUSCULAR | Status: DC | PRN
  Administered 2016-08-10: 10 mL via INTRA_ARTERIAL

## 2016-08-10 MED ORDER — HEPARIN (PORCINE) IN NACL 2-0.9 UNIT/ML-% IJ SOLN
INTRAMUSCULAR | Status: DC | PRN
  Administered 2016-08-10: 2000 mL

## 2016-08-10 MED ORDER — IOPAMIDOL (ISOVUE-370) INJECTION 76%
INTRAVENOUS | Status: AC
Start: 2016-08-10 — End: 2016-08-10
  Filled 2016-08-10: qty 100

## 2016-08-10 MED ORDER — SODIUM CHLORIDE 0.9 % IV SOLN: INTRAVENOUS | Status: AC

## 2016-08-10 MED ORDER — ADENOSINE 12 MG/4ML IV SOLN
INTRAVENOUS | Status: AC
  Filled 2016-08-10: qty 12

## 2016-08-10 SURGICAL SUPPLY — 12 items
CATH 5FR JL3.5 JR4 ANG PIG MP (CATHETERS) ×1 IMPLANT
CATH VISTA GUIDE 6FR JR4 (CATHETERS) ×1 IMPLANT
DEVICE RAD COMP TR BAND LRG (VASCULAR PRODUCTS) ×1 IMPLANT
GLIDESHEATH SLEND SS 6F .021 (SHEATH) ×1 IMPLANT
GUIDEWIRE INQWIRE 1.5J.035X260 (WIRE) IMPLANT
GUIDEWIRE PRESSURE COMET II (WIRE) ×1 IMPLANT
INQWIRE 1.5J .035X260CM (WIRE) ×2
KIT ESSENTIALS PG (KITS) ×1 IMPLANT
KIT HEART LEFT (KITS) ×2 IMPLANT
PACK CARDIAC CATHETERIZATION (CUSTOM PROCEDURE TRAY) ×2 IMPLANT
TRANSDUCER W/STOPCOCK (MISCELLANEOUS) ×2 IMPLANT
TUBING CIL FLEX 10 FLL-RA (TUBING) ×2 IMPLANT

## 2016-08-10 NOTE — Progress Notes (Signed)
Subjective: No complaints this morning, denies dyspnea or chest pain. Continues to have good urine output. Talked with cardiology yesterday and understands the plan to proceed with cath this morning.  Net -3 lbs, -2.8 L Telemetry reviewed, intermittent PVCs and short 5-beat run of Vtach around 4 am  Objective: Vital signs in last 24 hours: Vitals:   08/09/16 1357 08/09/16 2047 08/09/16 2050 08/10/16 0500  BP: 115/69 (!) 140/91  131/88  Pulse: 89 84  85  Resp:  18  18  Temp: 98.6 F (37 C) 98 F (36.7 C)  97.8 F (36.6 C)  TempSrc: Oral Oral  Oral  SpO2: 100% 100% 98% 100%  Weight:    173 lb 12.8 oz (78.8 kg)  Height:        Intake/Output Summary (Last 24 hours) at 08/10/16 0659 Last data filed at 08/10/16 0645  Gross per 24 hour  Intake           244.17 ml  Output             3100 ml  Net         -2855.83 ml   Filed Weights   08/08/16 0341 08/09/16 0546 08/10/16 0500  Weight: 189 lb 8 oz (86 kg) 176 lb 1.6 oz (79.9 kg) 173 lb 12.8 oz (78.8 kg)   Physical Exam General appearance: [name] resting in bed under a blanket, diffuse dry/rough appearing skin, NAD HENT: Normocephalic, atraumatic, moist MMs Cardiovascular: Regular rate and rhythm, no murmurs, rubs, gallops Respiratory/Chest: Clear to ausculation bilaterally, normal work of breathing Abdomen: Soft, non-tender, non-distended Skin: Warm, dry, diffuse coarse appearing skin Ext: Well perfused, no edema  Psych: Blunt affect  Labs / Imaging / Procedures: CBC Latest Ref Rng & Units 08/07/2016 07/06/2015 01/21/2015  WBC 4.0 - 10.5 K/uL 5.7 3.1(L) 4.0(A)  Hemoglobin 13.0 - 17.0 g/dL 08.6 57.8 13.6(A)  Hematocrit 39.0 - 52.0 % 42.0 43.9 42.1(A)  Platelets 150 - 400 K/uL 197 233 -   BMP Latest Ref Rng & Units 08/10/2016 08/09/2016 08/08/2016  Glucose 65 - 99 mg/dL 87 91 89  BUN 6 - 20 mg/dL 15 13 15   Creatinine 0.61 - 1.24 mg/dL 4.69 6.29 5.28  Sodium 135 - 145 mmol/L 137 138 139  Potassium 3.5 - 5.1  mmol/L 3.9 3.7 3.7  Chloride 101 - 111 mmol/L 99(L) 98(L) 100(L)  CO2 22 - 32 mmol/L 28 32 31  Calcium 8.9 - 10.3 mg/dL 9.1 9.2 8.9   No results found.   TTE - LVEF 35% and diffuse hypokinesis, grade 2 diastolic dysfunction  Assessment/Plan: Christian Horton is a 51 y.o. man with PMH asthma anduntreated HTN admitted for dyspnea and presumed CHF.   Principal Problem:   Dyspnea Active Problems:   Essential hypertension   Mild intermittent asthma with exacerbation   Congestive heart failure (HCC)   Hilar adenopathy   Acute systolic CHF (congestive heart failure), NYHA class 3 (HCC)   Precordial pain   Dilated cardiomyopathy (HCC)  Acute systolic and diastolic CHF, presenting with dyspnea and exercise intolerance, history of untreated HTN. TTE revealed LVEF 35% and G2DD with diffuse hypokinesis. Symptoms improved with diuresis, at dry weight today 173 lbs.  -- NPO and LHC today -- Lasix 40 mg po QD -- Strict I/Os and daily weights -- Telemetry -- ACE - Lisinopril -- BB - Carvedilol 3.125 mg BID  HLD, lipid panel checked, LVL elevated and ASCVD risk 9% -- Plan to start statin today, decision  pending cath results, extent of CAD  RUL consolidation, incidental finding on CT, no symptoms of pneumonia or evidence of infection illustrated in lab findings, infectious vs inflammatory vs asymmetric edema -- F/up with CT in 6 weeks  Asthma, No PFTs on file. -- Duonebs Q6H prn -- Dulera BID  Hypertension -- Lisinopril 20 mg daily  Eczema -- hydrocerin mosturizer twice daily  Dispo: Anticipated discharge in approximately 0-1 day(s).    LOS: 2 days   Althia Forts, MD 08/10/2016, 6:59 AM Pager: [phone]

## 2016-08-10 NOTE — Interval H&P Note (Signed)
History and Physical Interval Note:  08/10/2016 4:01 PM  Christian Horton  has presented today for cardiac catheterization, with the diagnosis of acute systolic heart failure and chest pain. The various methods of treatment have been discussed with the patient and family. After consideration of risks, benefits and other options for treatment, the patient has consented to  Procedure(s): Left Heart Cath and Coronary Angiography (N/A) as a surgical intervention .  The patient's history has been reviewed, patient examined, no change in status, stable for surgery.  I have reviewed the patient's chart and labs.  Questions were answered to the patient's satisfaction.    Cath Lab Visit (complete for each Cath Lab visit)  Clinical Evaluation Leading to the Procedure:   ACS: No.  Non-ACS:    Anginal Classification: CCS IV  Anti-ischemic medical therapy: Minimal Therapy (1 class of medications)  Non-Invasive Test Results: High-risk stress test findings: cardiac mortality >3%/year (LVEF 35% by echo)  Prior CABG: No previous CABG  Christian Horton

## 2016-08-10 NOTE — Progress Notes (Signed)
Patient states he cannot take Aspirin, so dose held this morning and noted in allergies list.  Per Dr. Gibson Ramp note, he is aware.  Christian Horton

## 2016-08-10 NOTE — Progress Notes (Signed)
TR BAND REMOVAL  LOCATION:    right radial  DEFLATED PER PROTOCOL:    Yes.    TIME BAND OFF / DRESSING APPLIED:    2200    SITE UPON ARRIVAL:    Level 0  SITE AFTER BAND REMOVAL:    Level 0  CIRCULATION SENSATION AND MOVEMENT:    Within Normal Limits   Yes.    COMMENTS:   Pt tolerated well

## 2016-08-10 NOTE — Progress Notes (Signed)
Progress Note  Patient Name: Christian Horton Date of Encounter: 08/10/2016  Primary Cardiologist: Anne Fu  Subjective   No chest pain  Inpatient Medications    Scheduled Meds: . aspirin  81 mg Oral Once  . carvedilol  3.125 mg Oral BID WC  . enoxaparin (LOVENOX) injection  40 mg Subcutaneous Q24H  . furosemide  40 mg Oral Daily  . hydrocerin   Topical BID  . lisinopril  20 mg Oral Daily  . mometasone-formoterol  2 puff Inhalation BID  . sodium chloride flush  3 mL Intravenous Q12H   Continuous Infusions: . sodium chloride 10 mL/hr at 08/10/16 0620   PRN Meds: sodium chloride, acetaminophen, ipratropium-albuterol, ondansetron (ZOFRAN) IV, sodium chloride flush   Vital Signs    Vitals:   08/09/16 1357 08/09/16 2047 08/09/16 2050 08/10/16 0500  BP: 115/69 (!) 140/91  131/88  Pulse: 89 84  85  Resp:  18  18  Temp: 98.6 F (37 C) 98 F (36.7 C)  97.8 F (36.6 C)  TempSrc: Oral Oral  Oral  SpO2: 100% 100% 98% 100%  Weight:    173 lb 12.8 oz (78.8 kg)  Height:        Intake/Output Summary (Last 24 hours) at 08/10/16 0933 Last data filed at 08/10/16 0645  Gross per 24 hour  Intake           244.17 ml  Output             3100 ml  Net         -2855.83 ml   Filed Weights   08/08/16 0341 08/09/16 0546 08/10/16 0500  Weight: 189 lb 8 oz (86 kg) 176 lb 1.6 oz (79.9 kg) 173 lb 12.8 oz (78.8 kg)    Telemetry    sinus - Personally Reviewed  ECG    N/A  Physical Exam   GEN: No acute distress.   Neck: No JVD Cardiac: RRR, no murmurs, rubs, or gallops.  Respiratory: Clear to auscultation bilaterally. GI: Soft, nontender, non-distended  MS: No edema; No deformity. Neuro:  Nonfocal  Psych: Normal affect   Labs    Chemistry Recent Labs Lab 08/08/16 0329 08/09/16 1024 08/10/16 0434  NA 139 138 137  K 3.7 3.7 3.9  CL 100* 98* 99*  CO2 31 32 28  GLUCOSE 89 91 87  BUN 15 13 15   CREATININE 1.22 1.24 1.16  CALCIUM 8.9 9.2 9.1  GFRNONAA >60 >60 >60    GFRAA >60 >60 >60  ANIONGAP 8 8 10      Hematology Recent Labs Lab 08/07/16 0654  WBC 5.7  RBC 5.33  HGB 13.8  HCT 42.0  MCV 78.8  MCH 25.9*  MCHC 32.9  RDW 15.4  PLT 197    Cardiac EnzymesNo results for input(s): TROPONINI in the last 168 hours.  Recent Labs Lab 08/07/16 0924  TROPIPOC 0.03     BNP Recent Labs Lab 08/07/16 0913  BNP 1,301.7*       Radiology    No results found.  Cardiac Studies   Echo 08/08/16 Left ventricle: The cavity size was normal. Wall thickness was   increased in a pattern of mild LVH. The estimated ejection   fraction was 35%. Diffuse hypokinesis. Features are consistent   with a pseudonormal left ventricular filling pattern, with   concomitant abnormal relaxation and increased filling pressure   (grade 2 diastolic dysfunction). - Aortic valve: There was no stenosis. - Mitral valve: There was moderate regurgitation. -  Left atrium: The atrium was moderately dilated. - Right ventricle: The cavity size was normal. Systolic function   was normal. - Right atrium: The atrium was mildly dilated. - Pulmonary arteries: No complete TR doppler jet so unable to   estimate PA systolic pressure. - Inferior vena cava: The vessel was normal in size. The   respirophasic diameter changes were in the normal range (>= 50%),   consistent with normal central venous pressure.  Impressions:  - Normal LV size with mild LV hypertrophy. EF 35%, diffuse   hypokinesis. Moderate diastolic dysfunction. Normal RV size and   systolic function. Moderate MR. Biatrial enlargement.  Patient Profile     51 y.o. male with history of HTN, non-compliance with medications and admitted with dyspnea, found to have volume overload. Echo with global hypokinesis.   Assessment & Plan    1. Cardiomyopathy/Acute systolic CHF: Volume status is back to baseline. Echo with LVEF=35%. There is moderate MR. Dyspnea resolved. Given LV dysfunction and MR, will plan cardiac  cath today to exclude obstructive CAD.   2. HTN: BP controlled.   Signed, Verne Carrow, MD  08/10/2016, 9:33 AM

## 2016-08-10 NOTE — Progress Notes (Signed)
Rn notified Dr Laural Benes with internal medicine that patient states he has not had a bowel movement since 08/06/16.

## 2016-08-10 NOTE — H&P (View-Only) (Signed)
Progress Note  Patient Name: Christian Horton Date of Encounter: 08/10/2016  Primary Cardiologist: Anne Fu  Subjective   No chest pain  Inpatient Medications    Scheduled Meds: . aspirin  81 mg Oral Once  . carvedilol  3.125 mg Oral BID WC  . enoxaparin (LOVENOX) injection  40 mg Subcutaneous Q24H  . furosemide  40 mg Oral Daily  . hydrocerin   Topical BID  . lisinopril  20 mg Oral Daily  . mometasone-formoterol  2 puff Inhalation BID  . sodium chloride flush  3 mL Intravenous Q12H   Continuous Infusions: . sodium chloride 10 mL/hr at 08/10/16 0620   PRN Meds: sodium chloride, acetaminophen, ipratropium-albuterol, ondansetron (ZOFRAN) IV, sodium chloride flush   Vital Signs    Vitals:   08/09/16 1357 08/09/16 2047 08/09/16 2050 08/10/16 0500  BP: 115/69 (!) 140/91  131/88  Pulse: 89 84  85  Resp:  18  18  Temp: 98.6 F (37 C) 98 F (36.7 C)  97.8 F (36.6 C)  TempSrc: Oral Oral  Oral  SpO2: 100% 100% 98% 100%  Weight:    173 lb 12.8 oz (78.8 kg)  Height:        Intake/Output Summary (Last 24 hours) at 08/10/16 0933 Last data filed at 08/10/16 0645  Gross per 24 hour  Intake           244.17 ml  Output             3100 ml  Net         -2855.83 ml   Filed Weights   08/08/16 0341 08/09/16 0546 08/10/16 0500  Weight: 189 lb 8 oz (86 kg) 176 lb 1.6 oz (79.9 kg) 173 lb 12.8 oz (78.8 kg)    Telemetry    sinus - Personally Reviewed  ECG    N/A  Physical Exam   GEN: No acute distress.   Neck: No JVD Cardiac: RRR, no murmurs, rubs, or gallops.  Respiratory: Clear to auscultation bilaterally. GI: Soft, nontender, non-distended  MS: No edema; No deformity. Neuro:  Nonfocal  Psych: Normal affect   Labs    Chemistry Recent Labs Lab 08/08/16 0329 08/09/16 1024 08/10/16 0434  NA 139 138 137  K 3.7 3.7 3.9  CL 100* 98* 99*  CO2 31 32 28  GLUCOSE 89 91 87  BUN 15 13 15   CREATININE 1.22 1.24 1.16  CALCIUM 8.9 9.2 9.1  GFRNONAA >60 >60 >60    GFRAA >60 >60 >60  ANIONGAP 8 8 10      Hematology Recent Labs Lab 08/07/16 0654  WBC 5.7  RBC 5.33  HGB 13.8  HCT 42.0  MCV 78.8  MCH 25.9*  MCHC 32.9  RDW 15.4  PLT 197    Cardiac EnzymesNo results for input(s): TROPONINI in the last 168 hours.  Recent Labs Lab 08/07/16 0924  TROPIPOC 0.03     BNP Recent Labs Lab 08/07/16 0913  BNP 1,301.7*       Radiology    No results found.  Cardiac Studies   Echo 08/08/16 Left ventricle: The cavity size was normal. Wall thickness was   increased in a pattern of mild LVH. The estimated ejection   fraction was 35%. Diffuse hypokinesis. Features are consistent   with a pseudonormal left ventricular filling pattern, with   concomitant abnormal relaxation and increased filling pressure   (grade 2 diastolic dysfunction). - Aortic valve: There was no stenosis. - Mitral valve: There was moderate regurgitation. -  Left atrium: The atrium was moderately dilated. - Right ventricle: The cavity size was normal. Systolic function   was normal. - Right atrium: The atrium was mildly dilated. - Pulmonary arteries: No complete TR doppler jet so unable to   estimate PA systolic pressure. - Inferior vena cava: The vessel was normal in size. The   respirophasic diameter changes were in the normal range (>= 50%),   consistent with normal central venous pressure.  Impressions:  - Normal LV size with mild LV hypertrophy. EF 35%, diffuse   hypokinesis. Moderate diastolic dysfunction. Normal RV size and   systolic function. Moderate MR. Biatrial enlargement.  Patient Profile     51 y.o. male with history of HTN, non-compliance with medications and admitted with dyspnea, found to have volume overload. Echo with global hypokinesis.   Assessment & Plan    1. Cardiomyopathy/Acute systolic CHF: Volume status is back to baseline. Echo with LVEF=35%. There is moderate MR. Dyspnea resolved. Given LV dysfunction and MR, will plan cardiac  cath today to exclude obstructive CAD.   2. HTN: BP controlled.   Signed, Verne Carrow, MD  08/10/2016, 9:33 AM

## 2016-08-11 ENCOUNTER — Encounter (HOSPITAL_COMMUNITY): Payer: Self-pay | Admitting: Internal Medicine

## 2016-08-11 DIAGNOSIS — Z91048 Other nonmedicinal substance allergy status: Secondary | ICD-10-CM

## 2016-08-11 DIAGNOSIS — K5900 Constipation, unspecified: Secondary | ICD-10-CM

## 2016-08-11 DIAGNOSIS — J4521 Mild intermittent asthma with (acute) exacerbation: Secondary | ICD-10-CM

## 2016-08-11 DIAGNOSIS — R590 Localized enlarged lymph nodes: Secondary | ICD-10-CM

## 2016-08-11 DIAGNOSIS — I2510 Atherosclerotic heart disease of native coronary artery without angina pectoris: Secondary | ICD-10-CM

## 2016-08-11 LAB — CBC
HEMATOCRIT: 45.9 % (ref 39.0–52.0)
Hemoglobin: 15.7 g/dL (ref 13.0–17.0)
MCH: 26.8 pg (ref 26.0–34.0)
MCHC: 34.2 g/dL (ref 30.0–36.0)
MCV: 78.3 fL (ref 78.0–100.0)
Platelets: 220 10*3/uL (ref 150–400)
RBC: 5.86 MIL/uL — ABNORMAL HIGH (ref 4.22–5.81)
RDW: 15.1 % (ref 11.5–15.5)
WBC: 4.9 10*3/uL (ref 4.0–10.5)

## 2016-08-11 LAB — BASIC METABOLIC PANEL
Anion gap: 8 (ref 5–15)
BUN: 21 mg/dL — ABNORMAL HIGH (ref 6–20)
CALCIUM: 8.9 mg/dL (ref 8.9–10.3)
CO2: 30 mmol/L (ref 22–32)
CREATININE: 1.33 mg/dL — AB (ref 0.61–1.24)
Chloride: 99 mmol/L — ABNORMAL LOW (ref 101–111)
GFR, EST NON AFRICAN AMERICAN: 60 mL/min — AB (ref >60)
Glucose, Bld: 83 mg/dL (ref 65–99)
Potassium: 3.9 mmol/L (ref 3.5–5.1)
Sodium: 137 mmol/L (ref 135–145)

## 2016-08-11 MED ORDER — LIVING BETTER WITH HEART FAILURE BOOK
Freq: Once | Status: AC
  Administered 2016-08-11

## 2016-08-11 MED ORDER — ATORVASTATIN CALCIUM 40 MG PO TABS: 40.0000 mg | ORAL_TABLET | Freq: Every day | ORAL | Status: DC

## 2016-08-11 MED ORDER — CARVEDILOL 3.125 MG PO TABS
6.2500 mg | ORAL_TABLET | Freq: Two times a day (BID) | ORAL | Status: DC
  Administered 2016-08-11: 6.25 mg via ORAL
  Filled 2016-08-11: qty 2

## 2016-08-11 MED ORDER — ATORVASTATIN CALCIUM 40 MG PO TABS: 40.0000 mg | ORAL_TABLET | Freq: Every day | ORAL | 2 refills | Status: DC

## 2016-08-11 MED ORDER — CLOPIDOGREL BISULFATE 75 MG PO TABS: 75.0000 mg | ORAL_TABLET | Freq: Every day | ORAL | 2 refills | Status: DC

## 2016-08-11 MED ORDER — FUROSEMIDE 40 MG PO TABS: 40.0000 mg | ORAL_TABLET | Freq: Every day | ORAL | 2 refills | Status: DC

## 2016-08-11 MED ORDER — SENNOSIDES-DOCUSATE SODIUM 8.6-50 MG PO TABS: 1.0000 | ORAL_TABLET | Freq: Every evening | ORAL | 0 refills | Status: DC | PRN

## 2016-08-11 MED ORDER — POLYETHYLENE GLYCOL 3350 17 G PO PACK
17.0000 g | PACK | Freq: Every day | ORAL | Status: DC
  Administered 2016-08-11: 17 g via ORAL
  Filled 2016-08-11: qty 1

## 2016-08-11 MED ORDER — CLOPIDOGREL BISULFATE 75 MG PO TABS
75.0000 mg | ORAL_TABLET | Freq: Every day | ORAL | Status: DC
  Administered 2016-08-11: 75 mg via ORAL
  Filled 2016-08-11: qty 1

## 2016-08-11 MED ORDER — LISINOPRIL 20 MG PO TABS: 20.0000 mg | ORAL_TABLET | Freq: Every day | ORAL | 2 refills | Status: DC

## 2016-08-11 MED ORDER — CARVEDILOL 6.25 MG PO TABS: 6.2500 mg | ORAL_TABLET | Freq: Two times a day (BID) | ORAL | 2 refills | Status: DC

## 2016-08-11 MED ORDER — POLYETHYLENE GLYCOL 3350 17 G PO PACK: 17.0000 g | PACK | Freq: Every day | ORAL | 0 refills | Status: DC

## 2016-08-11 MED FILL — ?LISINOPRIL 20 MG TABLET: 20 | 30 days supply | Qty: 30 | Fill #0

## 2016-08-11 MED FILL — ?FUROSEMIDE 40 MG TABLET: 40 | 30 days supply | Qty: 30 | Fill #0

## 2016-08-11 MED FILL — ATORVASTATIN 40 MG TABLET: 40 | 30 days supply | Qty: 30 | Fill #0

## 2016-08-11 MED FILL — CLOPIDOGREL 75 MG TABLET: 75 | 30 days supply | Qty: 30 | Fill #0

## 2016-08-11 MED FILL — CARVEDILOL 6.25 MG TABLET: 6.25 | 60 days supply | Qty: 60 | Fill #0

## 2016-08-11 NOTE — Accreditation Note (Signed)
Pt given all discharged orders and discussed chf info.  Pt is aware he needs to monitor his weight each morning and eat a low salt diet.  Pt has been given multiple forms of info about his new meds and verbalizes understanding of such.  Pt to be discharged home via transit bus.  All belongings with pt.

## 2016-08-11 NOTE — Progress Notes (Signed)
Subjective: Feeling okay this morning. Had a headache overnight since cath yesterday. Resolved with Tylenol. Cath yesterday revealed moderate CAD, no hemodynamically significant stenoses.  Net +5 lbs, -0.1L Telemetry reviewed, one episode of sinus tach to 130s overnight  Objective: Vital signs in last 24 hours: Vitals:   08/10/16 1930 08/10/16 1931 08/10/16 2155 08/11/16 0439  BP: (!) 139/100 (!) 135/96 (!) 136/91 (!) 137/93  Pulse: 85 82 83   Resp: 18 14  19   Temp:  97.3 F (36.3 C)  98.7 F (37.1 C)  TempSrc:  Axillary  Oral  SpO2: 97% 97%  96%  Weight:    178 lb 9.2 oz (81 kg)  Height:        Intake/Output Summary (Last 24 hours) at 08/11/16 0717 Last data filed at 08/10/16 2245  Gross per 24 hour  Intake           333.75 ml  Output              450 ml  Net          -116.25 ml   Filed Weights   08/09/16 0546 08/10/16 0500 08/11/16 0439  Weight: 176 lb 1.6 oz (79.9 kg) 173 lb 12.8 oz (78.8 kg) 178 lb 9.2 oz (81 kg)   Physical Exam General appearance: Christian Horton resting in bed under a blanket, conversational HENT: Normocephalic, atraumatic, moist MMs Cardiovascular: Mildly tachycardic rate and regular rhythm, no murmurs, rubs, gallops Respiratory/Chest: Clear to ausculation bilaterally, normal work of breathing Abdomen: Soft, non-tender, non-distended Skin: Warm, dry, diffuse coarse appearing skin Ext: Well perfused, no edema   Labs / Imaging / Procedures: CBC Latest Ref Rng & Units 08/11/2016 08/10/2016 08/07/2016  WBC 4.0 - 10.5 K/uL 4.9 5.4 5.7  Hemoglobin 13.0 - 17.0 g/dL 96.2 83.6 62.9  Hematocrit 39.0 - 52.0 % 45.9 46.5 42.0  Platelets 150 - 400 K/uL 220 224 197   BMP Latest Ref Rng & Units 08/11/2016 08/10/2016 08/09/2016  Glucose 65 - 99 mg/dL 83 87 91  BUN 6 - 20 mg/dL 47(M) 15 13  Creatinine 0.61 - 1.24 mg/dL 5.46(T) 0.35 4.65  Sodium 135 - 145 mmol/L 137 137 138  Potassium 3.5 - 5.1 mmol/L 3.9 3.9 3.7  Chloride 101 - 111 mmol/L 99(L)  99(L) 98(L)  CO2 22 - 32 mmol/L 30 28 32  Calcium 8.9 - 10.3 mg/dL 8.9 9.1 9.2   No results found.   Assessment/Plan: [name] is a 51 y.o. man with PMH asthma anduntreated HTN admitted for dyspnea and presumed CHF.   Principal Problem:   Dyspnea Active Problems:   Essential hypertension   Mild intermittent asthma with exacerbation   Congestive heart failure (HCC)   Hilar adenopathy   Acute systolic CHF (congestive heart failure), NYHA class 3 (HCC)   Precordial pain   Dilated cardiomyopathy (HCC)  Acute systolic and diastolic CHF, presenting with dyspnea and exercise intolerance, history of untreated HTN. TTE revealed LVEF 35% and G2DD with diffuse hypokinesis. Symptoms improved with diuresis, at dry weight today 173 lbs. LHC on 3/22 revealed moderate CADno hemodynamically significant coronary artery stenoses, up to 70% in 3 locations including proximal LAD, rec continued medical management. -- Recommend starting Plavix 75 mg daily (allergy to ASA), will discuss with Christian Horton if he would be willing to take this medication. -- Lasix 40 mg po QD -- Strict I/Os and daily weights -- Telemetry -- ACE - Lisinopril 20 mg -- BB - increase tp Carvedilol  6.25 mg BID, bp and HR remain slightly elevated  HLD, lipid panel checked, LDL elevated 136, ASCVD risk 9-10% -- Started Lipitor 40 mg  Mild AKI, creatinine 1.33 from 1.16 yesterday, likely due to prolonged NPO yesterday and contrast with cath -- Encourage po intake today -- Follow PM BMP  Constipation - reports no BM in several days -- Senna BID -- Miralax scheduled today  RUL consolidation, incidental finding on CT, no symptoms of pneumonia or evidence of infection illustrated in lab findings, infectious vs inflammatory vs asymmetric edema -- F/up with CT in 6 weeks  Asthma, No PFTs on file. -- Duonebs Q6H prn -- Dulera BID  Hypertension -- Lisinopril 20 mg daily  Eczema -- hydrocerin mosturizer twice  daily  Dispo: Anticipated discharge this afternoon. Will likely need match letter from [facility].   LOS: 3 days   Althia Forts, MD 08/11/2016, 7:17 AM Pager: [phone]

## 2016-08-11 NOTE — Progress Notes (Signed)
SUBJECTIVE: No chest pain or dyspnea. Only c/o headache  Tele: sinus  BP (!) 137/93 (BP Location: Left Arm)   Pulse 83   Temp 98.7 F (37.1 C) (Oral)   Resp 19   Ht 6\' 2"  (1.88 m)   Wt 178 lb 9.2 oz (81 kg)   SpO2 96%   BMI 22.93 kg/m   Intake/Output Summary (Last 24 hours) at 08/11/16 0804 Last data filed at 08/10/16 2245  Gross per 24 hour  Intake           333.75 ml  Output              450 ml  Net          -116.25 ml    PHYSICAL EXAM General: Well developed, well nourished, in no acute distress. Alert and oriented x 3.  Psych:  Good affect, responds appropriately Neck: No JVD. No masses noted.  Lungs: Clear bilaterally with no wheezes or rhonci noted.  Heart: RRR with no murmurs noted. Abdomen: Bowel sounds are present. Soft, non-tender.  Extremities: No lower extremity edema.   LABS: Basic Metabolic Panel:  Recent Labs  16/10/96 0434 08/11/16 0137  NA 137 137  K 3.9 3.9  CL 99* 99*  CO2 28 30  GLUCOSE 87 83  BUN 15 21*  CREATININE 1.16 1.33*  CALCIUM 9.1 8.9  MG 2.0  --    CBC:  Recent Labs  08/10/16 0434 08/11/16 0137  WBC 5.4 4.9  HGB 15.6 15.7  HCT 46.5 45.9  MCV 78.2 78.3  PLT 224 220   Fasting Lipid Panel:  Recent Labs  08/10/16 0434  CHOL 236*  HDL 74  LDLCALC 136*  TRIG 129  CHOLHDL 3.2    Current Meds: . atorvastatin  40 mg Oral q1800  . carvedilol  6.25 mg Oral BID WC  . enoxaparin (LOVENOX) injection  40 mg Subcutaneous Q24H  . furosemide  40 mg Oral Daily  . hydrocerin   Topical BID  . lisinopril  20 mg Oral Daily  . mometasone-formoterol  2 puff Inhalation BID  . senna-docusate  1 tablet Oral BID  . sodium chloride flush  3 mL Intravenous Q12H   Echo 08/08/16: Left ventricle: The cavity size was normal. Wall thickness was   increased in a pattern of mild LVH. The estimated ejection   fraction was 35%. Diffuse hypokinesis. Features are consistent   with a pseudonormal left ventricular filling pattern,  with   concomitant abnormal relaxation and increased filling pressure   (grade 2 diastolic dysfunction). - Aortic valve: There was no stenosis. - Mitral valve: There was moderate regurgitation. - Left atrium: The atrium was moderately dilated. - Right ventricle: The cavity size was normal. Systolic function   was normal. - Right atrium: The atrium was mildly dilated. - Pulmonary arteries: No complete TR doppler jet so unable to   estimate PA systolic pressure. - Inferior vena cava: The vessel was normal in size. The   respirophasic diameter changes were in the normal range (>= 50%),   consistent with normal central venous pressure.  Impressions:  - Normal LV size with mild LV hypertrophy. EF 35%, diffuse   hypokinesis. Moderate diastolic dysfunction. Normal RV size and   systolic function. Moderate MR. Biatrial enlargement.  Cardiac cath 08/10/16: Diagnostic Diagram        ASSESSMENT AND PLAN:  1. Non-ischemic cardiomyopathyAcute systolic CHF: Pt has LV systolic dysfunction out of proportion to extent of CAD.  Will continue medical management of cardiomyopathy with beta blocker and Ac-inh. Volume status ok. Continue daily Lasix.   2. CAD: Moderate CAD by cath 08/10/16. Continue beta blocker and statin. Consider adding Plavix 75 mg daily for antiplatelet therapy since he is allergic to ASA.   3. HTN: BP controlled.   OK to d/c home. Follow up in our office with Dr. Anne Fu. We will arrange.    Verne Carrow  3/23/20188:04 AM

## 2016-08-11 NOTE — Discharge Instructions (Signed)
We are glad that we have been able to improve your breathing and chest discomfort during this hospital stay. We have diagnosed you with moderate heart failure that we believe is due to uncontrolled high blood pressure. This means that your body will tend to hold onto fluid and salt, causing swelling in your legs and fluid build up your lungs. We have also found that you have some coronary artery disease.  For the heart failure we have started you on Lasix 40 mg once daily, a "fluid pill" To keep your heart failure from worsening and to control blood pressure we have started you on Lisinopril 20 mg daily, and Carvedilol (Coreg) 6.25 mg twice daily.  For your coronary artery disease we have started Plavix 75 mg daily, a blood thinner, and Atorvastatin (Lipitor) 40 mg to lower cholesterol.  For your constipation we have provided a short prescription for Miralax and Senna-Docusate, both of which you can take twice daily until you have a bowel movement.  Please pick up your medications from the [facility] pharmacy, they can help you to afford your medications.   Heart Failure Heart failure means your heart has trouble pumping blood. This makes it hard for your body to work well. Heart failure is usually a long-term (chronic) condition. You must take good care of yourself and follow your doctor's treatment plan. Follow these instructions at home:  Take your heart medicine as told by your doctor.  Do not stop taking medicine unless your doctor tells you to.  Do not skip any dose of medicine.  Refill your medicines before they run out.  Take other medicines only as told by your doctor or pharmacist.  Stay active if told by your doctor. The elderly and people with severe heart failure should talk with a doctor about physical activity.  Eat heart-healthy foods. Choose foods that are without trans fat and are low in saturated fat, cholesterol, and salt (sodium). This includes  fresh or frozen fruits and vegetables, fish, lean meats, fat-free or low-fat dairy foods, whole grains, and high-fiber foods. Lentils and dried peas and beans (legumes) are also good choices.  Limit salt if told by your doctor.  Cook in a healthy way. Roast, grill, broil, bake, poach, steam, or stir-fry foods.  Limit fluids as told by your doctor.  Weigh yourself every morning. Do this after you pee (urinate) and before you eat breakfast. Write down your weight to give to your doctor.  Take your blood pressure and write it down if your doctor tells you to.  Ask your doctor how to check your pulse. Check your pulse as told.  Lose weight if told by your doctor.  Stop smoking or chewing tobacco. Do not use gum or patches that help you quit without your doctor's approval.  Schedule and go to doctor visits as told.  Nonpregnant women should have no more than 1 drink a day. Men should have no more than 2 drinks a day. Talk to your doctor about drinking alcohol.  Stop illegal drug use.  Stay current with shots (immunizations).  Manage your health conditions as told by your doctor.  Learn to manage your stress.  Rest when you are tired.  If it is really hot outside:  Avoid intense activities.  Use air conditioning or fans, or get in a cooler place.  Avoid caffeine and alcohol.  Wear loose-fitting, lightweight, and light-colored clothing.  If it is really cold outside:  Avoid intense activities.  Layer  your clothing.  Wear mittens or gloves, a hat, and a scarf when going outside.  Avoid alcohol.  Learn about heart failure and get support as needed.  Get help to maintain or improve your quality of life and your ability to care for yourself as needed. Contact a doctor if:  You gain weight quickly.  You are more short of breath than usual.  You cannot do your normal activities.  You tire easily.  You cough more than normal, especially with activity.  You have  any or more puffiness (swelling) in areas such as your hands, feet, ankles, or belly (abdomen).  You cannot sleep because it is hard to breathe.  You feel like your heart is beating fast (palpitations).  You get dizzy or light-headed when you stand up. Get help right away if:  You have trouble breathing.  There is a change in mental status, such as becoming less alert or not being able to focus.  You have chest pain or discomfort.  You faint. This information is not intended to replace advice given to you by your health care provider. Make sure you discuss any questions you have with your health care provider. Document Released: 02/15/2008 Document Revised: 10/14/2015 Document Reviewed: 06/24/2012 Elsevier Interactive Patient Education  2017 [employer].

## 2016-08-13 ENCOUNTER — Encounter (HOSPITAL_COMMUNITY): Payer: Self-pay | Admitting: Internal Medicine

## 2016-08-13 DIAGNOSIS — E785 Hyperlipidemia, unspecified: Secondary | ICD-10-CM

## 2016-08-13 DIAGNOSIS — K5900 Constipation, unspecified: Secondary | ICD-10-CM

## 2016-08-13 DIAGNOSIS — I2510 Atherosclerotic heart disease of native coronary artery without angina pectoris: Secondary | ICD-10-CM

## 2016-08-13 HISTORY — DX: Hyperlipidemia, unspecified: E78.5

## 2016-08-13 HISTORY — DX: Atherosclerotic heart disease of native coronary artery without angina pectoris: I25.10

## 2016-08-16 ENCOUNTER — Encounter (INDEPENDENT_AMBULATORY_CARE_PROVIDER_SITE_OTHER): Payer: Self-pay | Admitting: Physician Assistant

## 2016-08-16 ENCOUNTER — Ambulatory Visit (INDEPENDENT_AMBULATORY_CARE_PROVIDER_SITE_OTHER): Payer: Self-pay | Admitting: Physician Assistant

## 2016-08-16 VITALS — BP 137/80 | HR 85 | Temp 98.2°F | Ht 74.0 in | Wt 192.0 lb

## 2016-08-16 DIAGNOSIS — I25118 Atherosclerotic heart disease of native coronary artery with other forms of angina pectoris: Secondary | ICD-10-CM

## 2016-08-16 MED ORDER — NITROGLYCERIN 0.4 MG SL SUBL: 0.4000 mg | SUBLINGUAL_TABLET | SUBLINGUAL | 3 refills | Status: DC | PRN

## 2016-08-16 NOTE — Patient Instructions (Addendum)
Keep your appointment with your cardiologist. Appointment time 10:00 am on April 5th at [address], [place], [place] [address].  Coronary Artery Disease, Male Coronary artery disease (CAD) is a condition in which the arteries that lead to the heart (coronary arteries) become narrow or blocked. The narrowing or blockage can lead to decreased blood flow to the heart. Prolonged reduced blood flow can cause a heart attack (myocardial infarction or MI). This condition may also be called coronary heart disease. Because CAD is the leading cause of death in men, it is important to understand what causes this condition and how it is treated. What are the causes? CAD is most often caused by atherosclerosis. This is the buildup of fat and cholesterol (plaque) on the inside of the arteries. Over time, the plaque may narrow or block the artery, reducing blood flow to the heart. Plaque can also become weak and break off within a coronary artery and cause a sudden blockage. Other less common causes of CAD include:  An embolism or blood clot in a coronary artery.  A tearing of the artery (spontaneous coronary artery dissection).  An aneurysm.  Inflammation (vasculitis) in the artery wall. What increases the risk? The following factors may make you more likely to develop this condition:  Age. Men over age 24 are at a greater risk of CAD.  Family history of CAD.  Gender. Men often develop CAD earlier in life than women.  High blood pressure (hypertension).  Diabetes.  High cholesterol levels.  Tobacco use.  Excessive alcohol use.  Lack of exercise.  A diet high in saturated and trans fats, such as fried food and processed meat. Other possible risk factors include:  High stress levels.  Depression.  Obesity.  Sleep apnea. What are the signs or symptoms? Many people do not have any symptoms during the early stages of CAD. As the condition progresses, symptoms may  include:  Chest pain (angina). The pain can:  Feel like a crushing or squeezing, or a tightness, pressure, fullness, or heaviness in the chest.  Last more than a few minutes or can stop and recur. The pain tends to get worse with exercise or stress and to fade with rest.  Pain in the arms, neck, jaw, or back.  Unexplained heartburn or indigestion.  Shortness of breath.  Nausea or vomiting.  Sudden light-headedness.  Sudden cold sweats.  Fluttering or fast heartbeat (palpitations). How is this diagnosed? This condition is diagnosed based on:  Your family and medical history.  A physical exam.  Tests, including:  A test to check the electrical signals in your heart (electrocardiogram).  Exercise stress test. This looks for signs of blockage when the heart is stressed with exercise, such as running on a treadmill.  Pharmacologic stress test. This test looks for signs of blockage when the heart is being stressed with a medicine.  Blood tests.  Coronary angiogram. This is a procedure to look at the coronary arteries to see if there is any blockage. During this test, a dye is injected into your arteries so they appear on an X-ray.  A test that uses sound waves to take a picture of your heart (echocardiogram).  Chest X-ray. How is this treated? This condition may be treated by:  Healthy lifestyle changes to reduce risk factors.  Medicines such as:  Antiplatelet medicines and blood-thinning medicines, such as aspirin. These help to prevent blood clots.  Nitroglycerin.  Blood pressure medicines.  Cholesterol-lowering medicine.  Coronary  angioplasty and stenting. During this procedure, a thin, flexible tube is inserted through a blood vessel and into a blocked artery. A balloon or similar device on the end of the tube is inflated to open up the artery. In some cases, a small, mesh tube (stent) is inserted into the artery to keep it open.  Coronary artery bypass  surgery. During this surgery, veins or arteries from other parts of the body are used to create a bypass around the blockage and allow blood to reach your heart. Follow these instructions at home: Medicines   Take over-the-counter and prescription medicines only as told by your health care provider.  Do not take the following medicines unless your health care provider approves:  NSAIDs, such as ibuprofen, naproxen, or celecoxib.  Vitamin supplements that contain vitamin A, vitamin E, or both. Lifestyle   Follow an exercise program approved by your health care provider. Aim for 150 minutes of moderate exercise or 75 minutes of vigorous exercise each week.  Maintain a healthy weight or lose weight as approved by your health care provider.  Rest when you are tired.  Learn to manage stress or try to limit your stress. Ask your health care provider for suggestions if you need help.  Get screened for depression and seek treatment, if needed.  Do not use any products that contain nicotine or tobacco, such as cigarettes and e-cigarettes. If you need help quitting, ask your health care provider.  Do not use illegal drugs. Eating and drinking   Follow a heart-healthy diet. A dietitian can help educate you about healthy food options and changes. In general, eat plenty of fruits and vegetables, lean meats, and whole grains.  Avoid foods high in:  Sugar.  Salt (sodium).  Saturated fat, such as processed or fatty meat.  Trans fat, such as fried foods.  Use healthy cooking methods such as roasting, grilling, broiling, baking, poaching, steaming, or stir-frying.  If you drink alcohol, and your health care provider approves, limit your alcohol intake to no more than 2 drinks per day. One drink equals 12 ounces of beer, 5 ounces of wine, or 1 ounces of hard liquor. General instructions   Manage any other health conditions, such as hypertension and diabetes. These conditions affect your  heart.  Your health care provider may ask you to monitor your blood pressure. Ideally, your blood pressure should be below 130/80.  Keep all follow-up visits as told by your health care provider. This is important. Get help right away if:  You have pain in your chest, neck, arm, jaw, stomach, or back that:  Lasts more than a few minutes.  Is recurring.  Is not relieved by taking medicine under your tongue (sublingualnitroglycerin).  You have too much (profuse) sweating without cause.  You have unexplained:  Heartburn or indigestion.  Shortness of breath or difficulty breathing.  Fluttering or fast heartbeat (palpitations).  Nausea or vomiting.  Fatigue.  Feelings of nervousness or anxiety.  Weakness.  Diarrhea.  You have sudden light-headedness or dizziness.  You faint.  You feel like hurting yourself or think about taking your own life. These symptoms may represent a serious problem that is an emergency. Do not wait to see if the symptoms will go away. Get medical help right away. Call your local emergency services (911 in the [place] Do not drive yourself to the hospital. Summary  Coronary artery disease (CAD) is a process in which the arteries that lead to the heart (coronary arteries) become  narrow or blocked. The narrowing or blockage can lead to a heart attack.  Many people do not have any symptoms during the early stages of CAD. This is called "silent CAD."  CAD can be treated with lifestyle changes, medicines, surgery, or a combination of these treatments. This information is not intended to replace advice given to you by your health care provider. Make sure you discuss any questions you have with your health care provider. Document Released: 12/03/2013 Document Revised: 04/28/2016 Document Reviewed: 04/28/2016 Elsevier Interactive Patient Education  2017 [employer].

## 2016-08-16 NOTE — Progress Notes (Signed)
Subjective:  Patient ID: Christian Horton, male    DOB: Jul 20, 1965  Age: 51 y.o. MRN: [id number]  CC: hospital f/u  HPI Christian Horton is a 51 y.o. male with a PMH of CHF and HTN presents for hospital f/u of HTN, CHF, CAD, and Right upper lobe consolidation. He was found to have Non-obstructive coronary artery disease, including 70% stenoses of the apical LAD and proximal/mid RCA as well as 60% disease involving medium-caliber first diagonal branch. Proximal/mid RCA lesion is not hemodynamically significant (FFR 0.86). Normal left ventricular filling pressure.  Patient states he is feeling better overall, however, he did feel mild chest pain today with exertion. Pain resolved with rest. Does not endorse symptoms of unstable angina. Has been taking medications as directed. Reports no side effects. Does not endorse any other symptoms.   ROS Review of Systems  Constitutional: Negative for chills, fever and malaise/fatigue.  Eyes: Negative for blurred vision.  Respiratory: Negative for shortness of breath.   Cardiovascular: Positive for chest pain. Negative for palpitations.  Gastrointestinal: Negative for abdominal pain and nausea.  Genitourinary: Negative for dysuria and hematuria.  Musculoskeletal: Negative for joint pain and myalgias.  Skin: Negative for rash.  Neurological: Negative for tingling and headaches.  Psychiatric/Behavioral: Negative for depression. The patient is not nervous/anxious.     Objective:  BP 137/80 (BP Location: Right Arm, Patient Position: Sitting, Cuff Size: Normal)   Pulse 85   Temp 98.2 F (36.8 C) (Oral)   Ht 6\' 2"  (1.88 m)   Wt 192 lb (87.1 kg)   SpO2 100%   BMI 24.65 kg/m   BP/Weight 08/16/2016 08/11/2016 05/23/2016  Systolic BP 137 124 135  Diastolic BP 80 86 78  Wt. (Lbs) 192 178.57 -  BMI 24.65 22.93 -      Physical Exam  Constitutional: He is oriented to person, place, and time.  Well developed, well nourished, NAD, polite  HENT:  Head:  Normocephalic and atraumatic.  Eyes: No scleral icterus.  Neck: Normal range of motion. Neck supple. No thyromegaly present.  Cardiovascular: Normal rate, regular rhythm and normal heart sounds.   Pulmonary/Chest: Effort normal and breath sounds normal.  Abdominal: Soft. Bowel sounds are normal. There is no tenderness.  Musculoskeletal: He exhibits no edema.  Neurological: He is alert and oriented to person, place, and time. No cranial nerve deficit. Coordination normal.  Skin: Skin is warm and dry. No rash noted. He is not diaphoretic. No erythema. No pallor.  Weathered skin  Psychiatric: He has a normal mood and affect. His behavior is normal. Thought content normal.  Vitals reviewed.    Assessment & Plan:   1. Coronary artery disease with stable angina pectoris, unspecified vessel or lesion type, unspecified whether native or transplanted heart [facility]) - Comprehensive metabolic panel, not performed since 06/2015. Had AST elevation at that time.  - PRN nitroGLYCERIN (NITROSTAT) 0.4 MG SL tablet; Place 1 tablet (0.4 mg total) under the tongue every 5 (five) minutes as needed for chest pain.  Dispense: 50 tablet; Refill: 3 - Continue taking hospital meds as directed. - No exertional activities.    *Unfortunately pt made two nonchalant comments about being time to die when questioned about his heart and when advised about future physical exams including prostate screenings. I tried reassuring him and encouraged him to keep his cardiology appointment.   Meds ordered this encounter  Medications  . nitroGLYCERIN (NITROSTAT) 0.4 MG SL tablet    Sig: Place 1 tablet (0.4 mg total) under the  tongue every 5 (five) minutes as needed for chest pain.    Dispense:  50 tablet    Refill:  3    Order Specific Question:   Supervising Provider    Answer:   Quentin Angst [[id number]]    Follow-up: Return in about 4 weeks (around 09/13/2016) for full physical exam.   Loletta Specter PA

## 2016-08-17 LAB — COMPREHENSIVE METABOLIC PANEL
A/G RATIO: 1.4 (ref 1.2–2.2)
ALK PHOS: 95 IU/L (ref 39–117)
ALT: 29 IU/L (ref 0–44)
AST: 37 IU/L (ref 0–40)
Albumin: 3.4 g/dL — ABNORMAL LOW (ref 3.5–5.5)
BILIRUBIN TOTAL: 0.2 mg/dL (ref 0.0–1.2)
BUN/Creatinine Ratio: 12 (ref 9–20)
BUN: 13 mg/dL (ref 6–24)
CALCIUM: 8.5 mg/dL — AB (ref 8.7–10.2)
CHLORIDE: 100 mmol/L (ref 96–106)
CO2: 24 mmol/L (ref 18–29)
Creatinine, Ser: 1.08 mg/dL (ref 0.76–1.27)
GFR calc Af Amer: 91 mL/min/{1.73_m2} (ref >59)
GFR, EST NON AFRICAN AMERICAN: 78 mL/min/{1.73_m2} (ref >59)
GLOBULIN, TOTAL: 2.5 g/dL (ref 1.5–4.5)
Glucose: 145 mg/dL — ABNORMAL HIGH (ref 65–99)
POTASSIUM: 4.1 mmol/L (ref 3.5–5.2)
Sodium: 141 mmol/L (ref 134–144)
Total Protein: 5.9 g/dL — ABNORMAL LOW (ref 6.0–8.5)

## 2016-08-21 MED FILL — NITROSTAT 0.4 MG TABLET SL: 0.4 | 10 days supply | Qty: 25 | Fill #0

## 2016-08-24 ENCOUNTER — Ambulatory Visit (INDEPENDENT_AMBULATORY_CARE_PROVIDER_SITE_OTHER): Payer: Self-pay | Admitting: Cardiology

## 2016-08-24 ENCOUNTER — Encounter: Payer: Self-pay | Admitting: Cardiology

## 2016-08-24 VITALS — BP 144/92 | HR 82 | Ht 73.0 in | Wt 197.4 lb

## 2016-08-24 DIAGNOSIS — E782 Mixed hyperlipidemia: Secondary | ICD-10-CM

## 2016-08-24 DIAGNOSIS — I2510 Atherosclerotic heart disease of native coronary artery without angina pectoris: Secondary | ICD-10-CM

## 2016-08-24 DIAGNOSIS — I10 Essential (primary) hypertension: Secondary | ICD-10-CM

## 2016-08-24 DIAGNOSIS — I428 Other cardiomyopathies: Secondary | ICD-10-CM

## 2016-08-24 MED ORDER — CARVEDILOL 6.25 MG PO TABS: 9.3750 mg | ORAL_TABLET | Freq: Two times a day (BID) | ORAL | 3 refills | Status: DC

## 2016-08-24 NOTE — Progress Notes (Signed)
Cardiology Office Note   Date:  08/24/2016   ID:  Christian Horton, DOB 08/30/1965, MRN [id number]  PCP:  Loletta Specter, PA-C  Cardiologist:  Dr. Anne Fu    Chief Complaint  Patient presents with  . Hospitalization Follow-up      History of Present Illness: Christian Horton is a 51 y.o. male who presents for hospital follow up date of discharge 08/11/16.   His past medical history significant for hypertension, asthma, and eczema who presented to the [facility] ED for evaluation of shortness of breath. He was found to have systolic and diastolic dysfunction on echo and we have been asked to consult. Pt admitted with acute systolic and diastolic HF.  EF 35%.  Mod MR, GgDD 2  New EKG changes.  Also with Rt upper lobe consolidation and adenopathy to be followed for CT lung in future with PCP.    He underwent cath and this revealed non obstructive disease including 70% stenoses of the apical LAD and proximal/mid RCA as well as 60% disease involving medium-caliber first diagonal branch. Proximal/mid RCA lesion is not hemodynamically significant (FFR 0.86).  Normal LV filling pressure.    Today he has no complaints except for some SOB.  He admits to eating at [employer] with salt.  We discussed importance of following diet.  He currently does not have scales.  He did not take his lasix - he actually takes at night and he forgets at times.  Discussed importance.  His BP is elevated today and HR 82.  No chest pain.     Past Medical History:  Diagnosis Date  . Allergy   . Asthma   . CAD (coronary artery disease) 08/13/2016  . Congestive heart failure (HCC) 08/08/2016  . Essential hypertension 02/18/2015  . Hilar adenopathy 08/09/2016  . Hyperlipidemia 08/13/2016    Past Surgical History:  Procedure Laterality Date  . INTRAVASCULAR PRESSURE WIRE/FFR STUDY N/A 08/10/2016   Procedure: Intravascular Pressure Wire/FFR Study;  Surgeon: Yvonne Kendall, MD;  Location: [facility] INVASIVE CV  LAB;  Service: Cardiovascular;  Laterality: N/A;  . LEFT HEART CATH AND CORONARY ANGIOGRAPHY N/A 08/10/2016   Procedure: Left Heart Cath and Coronary Angiography;  Surgeon: Yvonne Kendall, MD;  Location: [facility] INVASIVE CV LAB;  Service: Cardiovascular;  Laterality: N/A;  . NO PAST SURGERIES       Current Outpatient Prescriptions  Medication Sig Dispense Refill  . albuterol (PROVENTIL HFA;VENTOLIN HFA) 108 (90 Base) MCG/ACT inhaler Inhale 2 puffs into the lungs every 4 (four) hours as needed for wheezing or shortness of breath (cough, shortness of breath or wheezing.). 54 g 3  . atorvastatin (LIPITOR) 40 MG tablet Take 1 tablet (40 mg total) by mouth daily at 6 PM. 30 tablet 2  . carvedilol (COREG) 6.25 MG tablet Take 1 tablet (6.25 mg total) by mouth 2 (two) times daily with a meal. 60 tablet 2  . cetirizine (ZYRTEC) 10 MG tablet Take 1 tablet (10 mg total) by mouth daily. 30 tablet 11  . clopidogrel (PLAVIX) 75 MG tablet Take 1 tablet (75 mg total) by mouth daily. 30 tablet 2  . furosemide (LASIX) 40 MG tablet Take 1 tablet (40 mg total) by mouth daily. 30 tablet 2  . lisinopril (PRINIVIL,ZESTRIL) 20 MG tablet Take 1 tablet (20 mg total) by mouth daily. 30 tablet 2  . mometasone-formoterol (DULERA) 200-5 MCG/ACT AERO INHALE 2 PUFFS INTO THE LUNGS 2 TIMES DAILY. 39 Inhaler 3  . nitroGLYCERIN (NITROSTAT) 0.4 MG SL  tablet Place 1 tablet (0.4 mg total) under the tongue every 5 (five) minutes as needed for chest pain. 50 tablet 3  . senna-docusate (SENOKOT-S) 8.6-50 MG tablet Take 1 tablet by mouth at bedtime as needed for mild constipation. 20 tablet 0  . Skin Protectants, Misc. (EUCERIN) cream Apply topically as needed for dry skin. 454 g 0  . triamcinolone ointment (KENALOG) 0.1 % Apply 1 application topically 2 (two) times daily as needed (eczema).     No current facility-administered medications for this visit.     Allergies:   Fish allergy; Aspirin; Peanut-containing drug products;  Penicillins; and Powder    Social History:  The patient  reports that he quit smoking about 23 years ago. His smoking use included Cigarettes. He has a 8.50 pack-year smoking history. He has never used smokeless tobacco. He reports that he uses drugs, including Marijuana. He reports that he does not drink alcohol.   Family History:  The patient's family history includes Asthma in his mother and sister; Cancer in his father; Obesity in his sister; Stroke in his father.    ROS:  General:no colds or fevers, + increased weight- he does have clothes in place.   Skin:no rashes or ulcers HEENT:no blurred vision, no congestion CV:see HPI PUL:see HPI GI:no diarrhea constipation or melena, no indigestion GU:no hematuria, no dysuria MS:no joint pain, no claudication Neuro:no syncope, no lightheadedness Endo:no diabetes, no thyroid disease  Wt Readings from Last 3 Encounters:  08/24/16 197 lb 6.4 oz (89.5 kg)  08/16/16 192 lb (87.1 kg)  08/11/16 178 lb 9.2 oz (81 kg)     PHYSICAL EXAM: VS:  BP (!) 144/92   Pulse 82   Ht 6\' 1"  (1.854 m)   Wt 197 lb 6.4 oz (89.5 kg)   BMI 26.04 kg/m  , BMI Body mass index is 26.04 kg/m. General:Pleasant affect, NAD Skin:Warm and dry, brisk capillary refill HEENT:normocephalic, sclera clear, mucus membranes moist Neck:supple, no JVD, no bruits  Heart:S1S2 RRR without murmur, gallup, rub or click Lungs:clear without rales, rhonchi, or wheezes ZOX:WRUE, non tender, + BS, do not palpate liver spleen or masses Ext:1+ lower ext edema, 2+ pedal pulses, 2+ radial pulses Neuro:alert and oriented X 3, MAE, follows commands, + facial symmetry    EKG:  EKG is NOT ordered today.   Recent Labs: 08/07/2016: B Natriuretic Peptide 1,301.7 08/10/2016: Magnesium 2.0 08/11/2016: Hemoglobin 15.7; Platelets 220 08/16/2016: ALT 29; BUN 13; Creatinine, Ser 1.08; Potassium 4.1; Sodium 141    Lipid Panel    Component Value Date/Time   CHOL 236 (H) 08/10/2016 0434    TRIG 129 08/10/2016 0434   HDL 74 08/10/2016 0434   CHOLHDL 3.2 08/10/2016 0434   VLDL 26 08/10/2016 0434   LDLCALC 136 (H) 08/10/2016 0434       Other studies Reviewed: Additional studies/ records that were reviewed today include:   Echo 08/08/16. Study Conclusions  - Left ventricle: The cavity size was normal. Wall thickness was   increased in a pattern of mild LVH. The estimated ejection   fraction was 35%. Diffuse hypokinesis. Features are consistent   with a pseudonormal left ventricular filling pattern, with   concomitant abnormal relaxation and increased filling pressure   (grade 2 diastolic dysfunction). - Aortic valve: There was no stenosis. - Mitral valve: There was moderate regurgitation. - Left atrium: The atrium was moderately dilated. - Right ventricle: The cavity size was normal. Systolic function   was normal. - Right atrium: The atrium was  mildly dilated. - Pulmonary arteries: No complete TR doppler jet so unable to   estimate PA systolic pressure. - Inferior vena cava: The vessel was normal in size. The   respirophasic diameter changes were in the normal range (>= 50%),   consistent with normal central venous pressure.  Impressions:  - Normal LV size with mild LV hypertrophy. EF 35%, diffuse   hypokinesis. Moderate diastolic dysfunction. Normal RV size and   systolic function. Moderate MR. Biatrial enlargement.   Cardiac cath 08/10/16 Left Heart Cath and Coronary Angiography  Conclusion   Conclusions: 1. Non-obstructive coronary artery disease, including 70% stenoses of the apical LAD and proximal/mid RCA as well as 60% disease involving medium-caliber first diagonal branch. Proximal/mid RCA lesion is not hemodynamically significant (FFR 0.86). 2. Normal left ventricular filling pressure.  Recommendations: 1. Medical therapy of non-ischemic cardiomyopathy (LV dysfunction is out of proportion to coronary artery disease) and non-obstructive coronary  artery disease.    Chest CT   IMPRESSION: No demonstrable pulmonary embolus. Contrast bolus limited for assessment for potential thoracic dissection. No thoracic aortic aneurysm or thoracic aortic mucosal lesion evident on this study.  Airspace consolidation in the right upper lobe posterior segment and to a lesser extent superior segment right lower lobe, likely due to pneumonia. Pleural effusions noted bilaterally. Areas of adenopathy of uncertain etiology. Given the pneumonia, this adenopathy could be reactive. Underlying neoplasm is possible given this circumstance.  There are pleural effusions bilaterally with mild cardiomegaly. There is mild bibasilar interstitial edema. There is felt to be a degree of underlying congestive heart failure.  There is mild lower lobe bronchiectatic change bilaterally.  Reflux of contrast into the inferior vena cava may indicate an increase in right heart pressure.  ASSESSMENT AND PLAN:  1.  Nonischemic cardiomyopathy- volume overload today partly due to diet and partly due to not taking lasix - reminded of importance.  He will take an extra 40 mg lasix today and then take daily lasix.  Will also increase his coreg to 9.375 BID, going slowly as he has used inhalers for SOB with improvement.  He will follow up with Dr. Anne Fu or myself in 2 weeks for re-eval. continue ace   2. CAD - he does have CAD and needs treatment to prevent increase.  Continue BB, plavix ( allergy to asa)   3. HLD  On lipitor continue   4. Abnormal CT of chest - per PCP.   5. Essential hypertension increase of BB.    Current medicines are reviewed with the patient today.  The patient Has no concerns regarding medicines.  The following changes have been made:  See above Labs/ tests ordered today include:see above  Disposition:   FU:  see above  Signed, Nada Boozer, NP  08/24/2016 10:20 AM    [facility] [address] [place], [place],  [place]  [address]/ [address] [place], [place] Phone: [phone]; Fax: [phone]  [phone]

## 2016-08-24 NOTE — Patient Instructions (Signed)
Medication Instructions:  1. TAKE AN EXTRA 40 MG DOSE OF LASIX TONIGHT; STARTING TOMORROW YOU WILL RESUME LASIX 40 MG ONCE A DAY  2. INCREASE COREG TO 9.375 MG TWICE DAILY; NEW RX SENT IN TODAY  Labwork: NONE ORDERED   Testing/Procedures: NONE ORDERED  Follow-Up: DR. Anne Fu 2 WEEKS OR WITH LAURA INGOLD, NP SAME DAY DR. Anne Fu IS IN THE OFFICE IF POSSIBLE  Any Other Special Instructions Will Be Listed Below (If Applicable).     If you need a refill on your cardiac medications before your next appointment, please call your pharmacy.

## 2016-09-01 NOTE — Progress Notes (Signed)
Cardiology Office Note   Date:  09/04/2016   ID:  Christian Horton, DOB Jul 02, 1965, MRN [id number]  PCP:  Loletta Specter, PA-C  Cardiologist:  Dr. Anne Fu    Chief Complaint  Patient presents with  . Cardiomyopathy      History of Present Illness: Christian Horton is a 51 y.o. male who presents for CHF with non ischemic cardiomyopathy.  EF 35%.  G2DD.  His cath had non obstructive disease.   On last visit 08/24/16 he did have some SOB and also increased use of salt.  BP was elevated and HR 82.  I increased his lasix for a day and then he was to take the lasix every day as he had missed several days.  I titrated his coreg up as well.    Today he still has problems taking his meds.  But he has been trying more than he did.  His diet has improved.  He still has tightness in his chest with walking in the morning.  His inhaler usually helps this.  He has no other chest pain. 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  He has been wearing his mask.  He is concerned about money.     Past Medical History:  Diagnosis Date  . Allergy   . Asthma   . CAD (coronary artery disease) 08/13/2016  . Congestive heart failure (HCC) 08/08/2016  . Essential hypertension 02/18/2015  . Hilar adenopathy 08/09/2016  . Hyperlipidemia 08/13/2016    Past Surgical History:  Procedure Laterality Date  . INTRAVASCULAR PRESSURE WIRE/FFR STUDY N/A 08/10/2016   Procedure: Intravascular Pressure Wire/FFR Study;  Surgeon: Yvonne Kendall, MD;  Location: [facility] INVASIVE CV LAB;  Service: Cardiovascular;  Laterality: N/A;  . LEFT HEART CATH AND CORONARY ANGIOGRAPHY N/A 08/10/2016   Procedure: Left Heart Cath and Coronary Angiography;  Surgeon: Yvonne Kendall, MD;  Location: [facility] INVASIVE CV LAB;  Service: Cardiovascular;  Laterality: N/A;  . NO PAST SURGERIES       Current Outpatient Prescriptions  Medication Sig Dispense Refill  . albuterol (PROVENTIL HFA;VENTOLIN HFA) 108 (90 Base) MCG/ACT  inhaler Inhale 2 puffs into the lungs every 4 (four) hours as needed for wheezing or shortness of breath (cough, shortness of breath or wheezing.). 54 g 3  . atorvastatin (LIPITOR) 40 MG tablet Take 1 tablet (40 mg total) by mouth daily at 6 PM. 30 tablet 2  . carvedilol (COREG) 6.25 MG tablet Take 1.5 tablets (9.375 mg total) by mouth 2 (two) times daily. 270 tablet 3  . clopidogrel (PLAVIX) 75 MG tablet Take 1 tablet (75 mg total) by mouth daily. 30 tablet 2  . lisinopril (PRINIVIL,ZESTRIL) 20 MG tablet Take 1 tablet (20 mg total) by mouth daily. 30 tablet 2  . mometasone-formoterol (DULERA) 200-5 MCG/ACT AERO INHALE 2 PUFFS INTO THE LUNGS 2 TIMES DAILY. 39 Inhaler 3  . nitroGLYCERIN (NITROSTAT) 0.4 MG SL tablet Place 1 tablet (0.4 mg total) under the tongue every 5 (five) minutes as needed for chest pain. 50 tablet 3  . senna-docusate (SENOKOT-S) 8.6-50 MG tablet Take 1 tablet by mouth at bedtime as needed for mild constipation. 20 tablet 0  . Skin Protectants, Misc. (EUCERIN) cream Apply topically as needed for dry skin. 454 g 0  . triamcinolone ointment (KENALOG) 0.1 % Apply 1 application topically 2 (two) times daily as needed (eczema).     No current facility-administered medications for this visit.     Allergies:   Fish  allergy; Aspirin; Peanut-containing drug products; Penicillins; and Powder    Social History:  The patient  reports that he quit smoking about 23 years ago. His smoking use included Cigarettes. He has a 8.50 pack-year smoking history. He has never used smokeless tobacco. He reports that he uses drugs, including Marijuana. He reports that he does not drink alcohol.   Family History:  The patient's family history includes Asthma in his mother and sister; Cancer in his father; Obesity in his sister; Stroke in his father.    ROS:  General:no colds or fevers, + weight increase Skin:no rashes or ulcers HEENT:no blurred vision, no congestion CV:see HPI PUL:see HPI GI:no  diarrhea constipation or melena, no indigestion GU:no hematuria, no dysuria MS:no joint pain, no claudication Neuro:no syncope, no lightheadedness Endo:no diabetes, no thyroid disease  Wt Readings from Last 3 Encounters:  09/04/16 203 lb 12.8 oz (92.4 kg)  08/24/16 197 lb 6.4 oz (89.5 kg)  08/16/16 192 lb (87.1 kg)     PHYSICAL EXAM: VS:  BP (!) 148/94   Pulse 85   Ht 6\' 1"  (1.854 m)   Wt 203 lb 12.8 oz (92.4 kg)   SpO2 98%   BMI 26.89 kg/m  , BMI Body mass index is 26.89 kg/m. General:Pleasant affect, NAD Skin:Warm and dry, brisk capillary refill HEENT:normocephalic, sclera clear, mucus membranes moist Neck:supple, no JVD, no bruits  Heart:S1S2 RRR without murmur, gallup, rub or click Lungs:clear without rales, rhonchi, or wheezes ZOX:WRUE, non tender, + BS, do not palpate liver spleen or masses Ext:1-2+ lower ext edema, 2+ pedal pulses, 2+ radial pulses Neuro:alert and oriented X 3, MAE, follows commands, + facial symmetry    EKG:  EKG is NOT  ordered today.   Recent Labs: 08/07/2016: B Natriuretic Peptide 1,301.7 08/10/2016: Magnesium 2.0 08/11/2016: Hemoglobin 15.7; Platelets 220 08/16/2016: ALT 29; BUN 13; Creatinine, Ser 1.08; Potassium 4.1; Sodium 141    Lipid Panel    Component Value Date/Time   CHOL 236 (H) 08/10/2016 0434   TRIG 129 08/10/2016 0434   HDL 74 08/10/2016 0434   CHOLHDL 3.2 08/10/2016 0434   VLDL 26 08/10/2016 0434   LDLCALC 136 (H) 08/10/2016 0434       Other studies Reviewed: Additional studies/ records that were reviewed today include: . Cath  Conclusion   Conclusions: 1. Non-obstructive coronary artery disease, including 70% stenoses of the apical LAD and proximal/mid RCA as well as 60% disease involving medium-caliber first diagonal branch. Proximal/mid RCA lesion is not hemodynamically significant (FFR 0.86). 2. Normal left ventricular filling pressure.  Recommendations: 1. Medical therapy of non-ischemic cardiomyopathy (LV  dysfunction is out of proportion to coronary artery disease) and non-obstructive coronary artery disease.    ECHO: Study Conclusions  - Left ventricle: The cavity size was normal. Wall thickness was   increased in a pattern of mild LVH. The estimated ejection   fraction was 35%. Diffuse hypokinesis. Features are consistent   with a pseudonormal left ventricular filling pattern, with   concomitant abnormal relaxation and increased filling pressure   (grade 2 diastolic dysfunction). - Aortic valve: There was no stenosis. - Mitral valve: There was moderate regurgitation. - Left atrium: The atrium was moderately dilated. - Right ventricle: The cavity size was normal. Systolic function   was normal. - Right atrium: The atrium was mildly dilated. - Pulmonary arteries: No complete TR doppler jet so unable to   estimate PA systolic pressure. - Inferior vena cava: The vessel was normal in size. The  respirophasic diameter changes were in the normal range (>= 50%),   consistent with normal central venous pressure.  Impressions:  - Normal LV size with mild LV hypertrophy. EF 35%, diffuse   hypokinesis. Moderate diastolic dysfunction. Normal RV size and   systolic function. Moderate MR. Biatrial enlargement.   ASSESSMENT AND PLAN:  1.  NICM EF 35% today unsure how much of his meds he takes, seems the lasix is most difficult for him to take.  With his edema have asked him to take daily.  Will increase coreg to 12.5 mg BID.  He will follow up with me or Dr. Anne Fu in 4-6 weeks, he has PCP appt at end of month.  Once BP controlled and at stable dose of meds would recheck echo.   2.  CAD continue BB, Plavix (allergy to ASA)   3.  HLD continue statin- recheck in 6 weeks HDL.    5.  HTN essential.still elevated will increase the coreg.   6. Abnormal CT of chest per PCP   Current medicines are reviewed with the patient today.  The patient Has no concerns regarding medicines.  The  following changes have been made:  See above Labs/ tests ordered today include:see above  Disposition:   FU:  see above  Signed, Nada Boozer, NP  09/04/2016 2:46 PM    [facility] [address] [place], [place], [place]  [address]/ [address] [place], [place] Phone: [phone]; Fax: [phone]  [phone]

## 2016-09-04 ENCOUNTER — Ambulatory Visit (INDEPENDENT_AMBULATORY_CARE_PROVIDER_SITE_OTHER): Payer: Self-pay | Admitting: Cardiology

## 2016-09-04 ENCOUNTER — Encounter (INDEPENDENT_AMBULATORY_CARE_PROVIDER_SITE_OTHER): Payer: Self-pay

## 2016-09-04 ENCOUNTER — Encounter: Payer: Self-pay | Admitting: Cardiology

## 2016-09-04 VITALS — BP 148/94 | HR 85 | Ht 73.0 in | Wt 203.8 lb

## 2016-09-04 DIAGNOSIS — I2510 Atherosclerotic heart disease of native coronary artery without angina pectoris: Secondary | ICD-10-CM

## 2016-09-04 DIAGNOSIS — E782 Mixed hyperlipidemia: Secondary | ICD-10-CM

## 2016-09-04 DIAGNOSIS — I428 Other cardiomyopathies: Secondary | ICD-10-CM

## 2016-09-04 DIAGNOSIS — I10 Essential (primary) hypertension: Secondary | ICD-10-CM

## 2016-09-04 MED ORDER — FUROSEMIDE 40 MG PO TABS: 40.0000 mg | ORAL_TABLET | Freq: Every day | ORAL | 3 refills | Status: DC

## 2016-09-04 MED ORDER — CARVEDILOL 12.5 MG PO TABS: 12.5000 mg | ORAL_TABLET | Freq: Two times a day (BID) | ORAL | 6 refills | Status: DC

## 2016-09-04 MED FILL — CARVEDILOL 12.5 MG TABLET: 12.5 | 30 days supply | Qty: 60 | Fill #0

## 2016-09-04 NOTE — Patient Instructions (Signed)
Medication Instructions:  Your physician has recommended you make the following change in your medication:  1. Start lasix (40 mg ) daily 2. Increase Coreg (12.5 mg ) twice a day, sent in to patient's requested pharmacy.    Labwork: -None  Testing/Procedures: -None  Follow-Up: Your physician recommends that you keep your scheduled  follow-up appointment with Dr. Anne Fu.    Any Other Special Instructions Will Be Listed Below (If Applicable).     If you need a refill on your cardiac medications before your next appointment, please call your pharmacy.

## 2016-09-18 ENCOUNTER — Ambulatory Visit (INDEPENDENT_AMBULATORY_CARE_PROVIDER_SITE_OTHER): Payer: Self-pay | Admitting: Physician Assistant

## 2016-09-21 MED FILL — TRIAMCINOLONE 0.1% OINTMENT: 0.1 | 5 days supply | Qty: 20 | Fill #5

## 2016-09-25 MED FILL — ?CARVEDILOL 6.25 MG TABLET: 6.25 | 60 days supply | Qty: 60 | Fill #1

## 2016-09-28 ENCOUNTER — Encounter: Payer: Self-pay | Admitting: Cardiology

## 2016-10-02 ENCOUNTER — Telehealth (INDEPENDENT_AMBULATORY_CARE_PROVIDER_SITE_OTHER): Payer: Self-pay | Admitting: Physician Assistant

## 2016-10-02 NOTE — Telephone Encounter (Signed)
FWD to PCP. Yun Gutierrez S Ambria Mayfield, CMA  

## 2016-10-02 NOTE — Telephone Encounter (Signed)
Patient called requesting refill triamcinolone ointment (KENALOG) 0.1 %   Please send to [facility]

## 2016-10-03 NOTE — Telephone Encounter (Signed)
Patient notified. Will discuss at next appointment. Maryjean Morn, CMA

## 2016-10-03 NOTE — Telephone Encounter (Signed)
Sorry, we met once and talked about his heart. He needs to be evaluated in order to be treated.

## 2016-10-04 ENCOUNTER — Ambulatory Visit (INDEPENDENT_AMBULATORY_CARE_PROVIDER_SITE_OTHER): Payer: Self-pay | Admitting: Physician Assistant

## 2016-10-20 ENCOUNTER — Ambulatory Visit (INDEPENDENT_AMBULATORY_CARE_PROVIDER_SITE_OTHER): Payer: Self-pay | Admitting: Cardiology

## 2016-10-20 ENCOUNTER — Encounter: Payer: Self-pay | Admitting: Cardiology

## 2016-10-20 ENCOUNTER — Encounter (INDEPENDENT_AMBULATORY_CARE_PROVIDER_SITE_OTHER): Payer: Self-pay

## 2016-10-20 VITALS — BP 150/88 | HR 72 | Ht 73.0 in | Wt 193.2 lb

## 2016-10-20 DIAGNOSIS — I2510 Atherosclerotic heart disease of native coronary artery without angina pectoris: Secondary | ICD-10-CM

## 2016-10-20 DIAGNOSIS — I428 Other cardiomyopathies: Secondary | ICD-10-CM

## 2016-10-20 DIAGNOSIS — I10 Essential (primary) hypertension: Secondary | ICD-10-CM

## 2016-10-20 DIAGNOSIS — I420 Dilated cardiomyopathy: Secondary | ICD-10-CM

## 2016-10-20 MED ORDER — CARVEDILOL 25 MG PO TABS: 25.0000 mg | ORAL_TABLET | Freq: Two times a day (BID) | ORAL | 6 refills | Status: DC

## 2016-10-20 NOTE — Patient Instructions (Addendum)
Medication Instructions:  Please increase your Carvedilol to 25 mg twice a day. Continue all other medications as listed.  Testing/Procedures: Your physician has requested that you have an echocardiogram in 2 months. Echocardiography is a painless test that uses sound waves to create images of your heart. It provides your doctor with information about the size and shape of your heart and how well your heart's chambers and valves are working. This procedure takes approximately one hour. There are no restrictions for this procedure.  Follow-Up: Follow up with Nada Boozer a few days after the echocardiogram  If you need a refill on your cardiac medications before your next appointment, please call your pharmacy.  Thank you for choosing [facility]!!

## 2016-10-20 NOTE — Progress Notes (Signed)
Cardiology Office Note   Date:  10/20/2016   ID:  Christian Horton, DOB November 01, 1965, MRN [id number]  PCP:  Loletta Specter, PA-C  Cardiologist:   Donato Schultz, MD   No chief complaint on file.     History of Present Illness: Christian Horton is a 51 y.o. male here for follow-up of nonischemic cardiomyopathy. Overall he is not having any shortness of breath. Minimal swelling noted in his pretibial region. He states that he is compliant with his medications. He has been having more eczema. No syncope, no orthopnea.     Past Medical History:  Diagnosis Date  . Allergy   . Asthma   . CAD (coronary artery disease) 08/13/2016  . Congestive heart failure (HCC) 08/08/2016  . Essential hypertension 02/18/2015  . Hilar adenopathy 08/09/2016  . Hyperlipidemia 08/13/2016    Past Surgical History:  Procedure Laterality Date  . INTRAVASCULAR PRESSURE WIRE/FFR STUDY N/A 08/10/2016   Procedure: Intravascular Pressure Wire/FFR Study;  Surgeon: Yvonne Kendall, MD;  Location: [facility] INVASIVE CV LAB;  Service: Cardiovascular;  Laterality: N/A;  . LEFT HEART CATH AND CORONARY ANGIOGRAPHY N/A 08/10/2016   Procedure: Left Heart Cath and Coronary Angiography;  Surgeon: Yvonne Kendall, MD;  Location: [facility] INVASIVE CV LAB;  Service: Cardiovascular;  Laterality: N/A;  . NO PAST SURGERIES       Current Outpatient Prescriptions  Medication Sig Dispense Refill  . albuterol (PROVENTIL HFA;VENTOLIN HFA) 108 (90 Base) MCG/ACT inhaler Inhale 2 puffs into the lungs every 4 (four) hours as needed for wheezing or shortness of breath (cough, shortness of breath or wheezing.). 54 g 3  . atorvastatin (LIPITOR) 40 MG tablet Take 1 tablet (40 mg total) by mouth daily at 6 PM. 30 tablet 2  . carvedilol (COREG) 25 MG tablet Take 1 tablet (25 mg total) by mouth 2 (two) times daily. 60 tablet 6  . clopidogrel (PLAVIX) 75 MG tablet Take 1 tablet (75 mg total) by mouth daily. 30 tablet 2  . furosemide (LASIX) 40 MG tablet  Take 1 tablet (40 mg total) by mouth daily. 30 tablet 3  . lisinopril (PRINIVIL,ZESTRIL) 20 MG tablet Take 1 tablet (20 mg total) by mouth daily. 30 tablet 2  . mometasone-formoterol (DULERA) 200-5 MCG/ACT AERO INHALE 2 PUFFS INTO THE LUNGS 2 TIMES DAILY. 39 Inhaler 3  . nitroGLYCERIN (NITROSTAT) 0.4 MG SL tablet Place 1 tablet (0.4 mg total) under the tongue every 5 (five) minutes as needed for chest pain. 50 tablet 3  . senna-docusate (SENOKOT-S) 8.6-50 MG tablet Take 1 tablet by mouth at bedtime as needed for mild constipation. 20 tablet 0  . Skin Protectants, Misc. (EUCERIN) cream Apply topically as needed for dry skin. 454 g 0  . triamcinolone ointment (KENALOG) 0.1 % Apply 1 application topically 2 (two) times daily as needed (eczema).     No current facility-administered medications for this visit.     Allergies:   Fish allergy; Aspirin; Peanut-containing drug products; Penicillins; and Powder    Social History:  The patient  reports that he quit smoking about 23 years ago. His smoking use included Cigarettes. He has a 8.50 pack-year smoking history. He has never used smokeless tobacco. He reports that he uses drugs, including Marijuana. He reports that he does not drink alcohol.   Family History:  The patient's family history includes Asthma in his mother and sister; Cancer in his father; Obesity in his sister; Stroke in his father.    ROS:  Please see the  history of present illness.   Otherwise, review of systems are positive for snoring, chest pain.   All other systems are reviewed and negative.    PHYSICAL EXAM: VS:  BP (!) 150/88   Pulse 72   Ht 6\' 1"  (1.854 m)   Wt 193 lb 3.2 oz (87.6 kg)   BMI 25.49 kg/m  , BMI Body mass index is 25.49 kg/m. GEN: Well nourished, well developed, in no acute distress  HEENT: normal  Neck: no JVD, carotid bruits, or masses Cardiac: RRR; no murmurs, rubs, or gallops,no edema  Respiratory:  clear to auscultation bilaterally, normal work of  breathing GI: soft, nontender, nondistended, + BS MS: no deformity or atrophy  Skin: warm and dry, eczema changes Neuro:  Alert and Oriented x 3, Strength and sensation are intact Psych: euthymic mood, full affect   EKG:  01/21/15-sinus bradycardia nonspecific ST-T wave changes with J-point elevation early precordial leads, left ventricular hypertrophy. Personally viewed   Recent Labs: 08/07/2016: B Natriuretic Peptide 1,301.7 08/10/2016: Magnesium 2.0 08/11/2016: Hemoglobin 15.7; Platelets 220 08/16/2016: ALT 29; BUN 13; Creatinine, Ser 1.08; Potassium 4.1; Sodium 141    Lipid Panel    Component Value Date/Time   CHOL 236 (H) 08/10/2016 0434   TRIG 129 08/10/2016 0434   HDL 74 08/10/2016 0434   CHOLHDL 3.2 08/10/2016 0434   VLDL 26 08/10/2016 0434   LDLCALC 136 (H) 08/10/2016 0434      Wt Readings from Last 3 Encounters:  10/20/16 193 lb 3.2 oz (87.6 kg)  09/04/16 203 lb 12.8 oz (92.4 kg)  08/24/16 197 lb 6.4 oz (89.5 kg)      Other studies Reviewed: Additional studies/ records that were reviewed today include: Prior EKG, lab work, office note. Review of the above records demonstrates: as above   ASSESSMENT AND PLAN:  Nonischemic cardiomyopathy  - Ejection fraction 35%  - Cardiac catheterization nonobstructive disease  - Coreg, will increase to goal of 25 mg twice a day.  - Continue with goal dose lisinopril  - Likely hypertensive cardiomyopathy.  - In 2 months we will go ahead in check an echocardiogram and have him see Nada Boozer, NP after this visit.     Signed, Donato Schultz, MD  10/20/2016 4:56 PM    [facility] [address] [place], [place], [place]  [address] Phone: [phone]; Fax: [phone]

## 2016-10-23 MED FILL — CARVEDILOL 25 MG TABLET: 25 | 30 days supply | Qty: 60 | Fill #0

## 2016-10-25 ENCOUNTER — Telehealth (INDEPENDENT_AMBULATORY_CARE_PROVIDER_SITE_OTHER): Payer: Self-pay | Admitting: Physician Assistant

## 2016-10-25 NOTE — Telephone Encounter (Signed)
FWD PCP. Maryjean Morn, CMA

## 2016-10-25 NOTE — Telephone Encounter (Signed)
I wrote the following last month and it still remains true: Sorry, we met once and talked about his heart. He needs to be evaluated in order to be treated.

## 2016-10-25 NOTE — Telephone Encounter (Signed)
Patient called stated needs refill on RX Kenalog ointment   Please send to [facility].

## 2016-10-26 NOTE — Telephone Encounter (Signed)
Patient informed that reason for ointment needs to be discussed at OV in order to have ointment prescribed. Maryjean Morn, CMA

## 2016-10-30 ENCOUNTER — Encounter (INDEPENDENT_AMBULATORY_CARE_PROVIDER_SITE_OTHER): Payer: Self-pay | Admitting: Physician Assistant

## 2016-10-30 ENCOUNTER — Ambulatory Visit (INDEPENDENT_AMBULATORY_CARE_PROVIDER_SITE_OTHER): Payer: Self-pay | Admitting: Physician Assistant

## 2016-10-30 VITALS — BP 120/81 | HR 66 | Temp 97.7°F | Resp 18 | Ht 73.0 in | Wt 190.0 lb

## 2016-10-30 DIAGNOSIS — L309 Dermatitis, unspecified: Secondary | ICD-10-CM

## 2016-10-30 MED ORDER — PREDNISONE 10 MG (21) PO TBPK: ORAL_TABLET | ORAL | 0 refills | Status: DC

## 2016-10-30 MED ORDER — TRIAMCINOLONE ACETONIDE 0.5 % EX OINT: 1.0000 "application " | TOPICAL_OINTMENT | Freq: Two times a day (BID) | CUTANEOUS | 5 refills | Status: DC

## 2016-10-30 MED FILL — TRIAMCINOLONE 0.5% OINTMENT: 0.5 | 30 days supply | Qty: 60 | Fill #0

## 2016-10-30 MED FILL — ?PREDNISONE 10 MG TABLET: 10 | 6 days supply | Qty: 21 | Fill #0

## 2016-10-30 NOTE — Patient Instructions (Signed)

## 2016-10-30 NOTE — Progress Notes (Signed)
Subjective:  Patient ID: Christian Horton, male    DOB: Jan 11, 1966  Age: 51 y.o. MRN: [id number]  CC: eczema  HPI Christian Horton is a 51 y.o. male with a PMH of CAD, CHF, HLD, HTN, and asthma presents with dry skin on upper and lower extremities. Says he has had eczema all his life. Has been to dermatology and is usually prescribed steroid ointments. Has also tried PUVA but reports no amelioration. Request steroid treatment. Does not endorse any other symptoms or complaints.     Outpatient Medications Prior to Visit  Medication Sig Dispense Refill  . albuterol (PROVENTIL HFA;VENTOLIN HFA) 108 (90 Base) MCG/ACT inhaler Inhale 2 puffs into the lungs every 4 (four) hours as needed for wheezing or shortness of breath (cough, shortness of breath or wheezing.). 54 g 3  . atorvastatin (LIPITOR) 40 MG tablet Take 1 tablet (40 mg total) by mouth daily at 6 PM. 30 tablet 2  . carvedilol (COREG) 25 MG tablet Take 1 tablet (25 mg total) by mouth 2 (two) times daily. 60 tablet 6  . clopidogrel (PLAVIX) 75 MG tablet Take 1 tablet (75 mg total) by mouth daily. 30 tablet 2  . furosemide (LASIX) 40 MG tablet Take 1 tablet (40 mg total) by mouth daily. 30 tablet 3  . lisinopril (PRINIVIL,ZESTRIL) 20 MG tablet Take 1 tablet (20 mg total) by mouth daily. 30 tablet 2  . mometasone-formoterol (DULERA) 200-5 MCG/ACT AERO INHALE 2 PUFFS INTO THE LUNGS 2 TIMES DAILY. 39 Inhaler 3  . nitroGLYCERIN (NITROSTAT) 0.4 MG SL tablet Place 1 tablet (0.4 mg total) under the tongue every 5 (five) minutes as needed for chest pain. 50 tablet 3  . senna-docusate (SENOKOT-S) 8.6-50 MG tablet Take 1 tablet by mouth at bedtime as needed for mild constipation. 20 tablet 0  . Skin Protectants, Misc. (EUCERIN) cream Apply topically as needed for dry skin. 454 g 0  . triamcinolone ointment (KENALOG) 0.1 % Apply 1 application topically 2 (two) times daily as needed (eczema).     No facility-administered medications prior to visit.       ROS Review of Systems  Constitutional: Negative for chills, fever and malaise/fatigue.  Eyes: Negative for blurred vision.  Respiratory: Negative for shortness of breath.   Cardiovascular: Negative for chest pain and palpitations.  Gastrointestinal: Negative for abdominal pain and nausea.  Genitourinary: Negative for dysuria and hematuria.  Musculoskeletal: Negative for joint pain and myalgias.  Skin: Positive for rash.  Neurological: Negative for tingling and headaches.  Psychiatric/Behavioral: Negative for depression. The patient is not nervous/anxious.     Objective:  BP 120/81 (BP Location: Right Arm, Patient Position: Sitting, Cuff Size: Normal)   Pulse 66   Temp 97.7 F (36.5 C) (Oral)   Resp 18   Ht 6\' 1"  (1.854 m)   Wt 190 lb (86.2 kg)   SpO2 99%   BMI 25.07 kg/m   BP/Weight 10/30/2016 10/20/2016 09/04/2016  Systolic BP 120 150 148  Diastolic BP 81 88 94  Wt. (Lbs) 190 193.2 203.8  BMI 25.07 25.49 26.89      Physical Exam  Constitutional: He is oriented to person, place, and time.  Well developed, well nourished, NAD, polite  HENT:  Head: Normocephalic and atraumatic.  Eyes: No scleral icterus.  Neck: Normal range of motion. Neck supple. No thyromegaly present.  Cardiovascular: Normal rate, regular rhythm and normal heart sounds.   Pulmonary/Chest: Effort normal and breath sounds normal.  Musculoskeletal: He exhibits no edema.  Neurological: He  is alert and oriented to person, place, and time.  Skin:  Severe drying and crusting of the skin of the upper extremity and lower extremity bilaterally. No erythema, increased warmth, suppuration, or bleeding.  Psychiatric: He has a normal mood and affect. His behavior is normal. Thought content normal.  Vitals reviewed.    Assessment & Plan:   1. Eczema, unspecified type - Begin triamcinolone ointment (KENALOG) 0.5 %; Apply 1 application topically 2 (two) times daily.  Dispense: 60 g; Refill: 5 - Begin  predniSONE (STERAPRED UNI-PAK 21 TAB) 10 MG (21) TBPK tablet; Use as directed on packet.  Dispense: 21 tablet; Refill: 0   Meds ordered this encounter  Medications  . triamcinolone ointment (KENALOG) 0.5 %    Sig: Apply 1 application topically 2 (two) times daily.    Dispense:  60 g    Refill:  5    Order Specific Question:   Supervising Provider    Answer:   Quentin Angst [id number]  . predniSONE (STERAPRED UNI-PAK 21 TAB) 10 MG (21) TBPK tablet    Sig: Use as directed on packet.    Dispense:  21 tablet    Refill:  0    Order Specific Question:   Supervising Provider    Answer:   Quentin Angst [id number]    Follow-up: Return if symptoms worsen or fail to improve.   Loletta Specter PA

## 2016-11-06 ENCOUNTER — Ambulatory Visit: Payer: Self-pay

## 2016-11-17 ENCOUNTER — Ambulatory Visit: Payer: Self-pay | Attending: Physician Assistant

## 2017-01-01 ENCOUNTER — Other Ambulatory Visit (HOSPITAL_COMMUNITY): Payer: Self-pay

## 2017-01-05 ENCOUNTER — Encounter: Payer: Self-pay | Admitting: Cardiology

## 2017-01-16 ENCOUNTER — Encounter: Payer: Self-pay | Admitting: Cardiology

## 2017-01-19 ENCOUNTER — Other Ambulatory Visit (HOSPITAL_COMMUNITY): Payer: Self-pay

## 2017-01-24 ENCOUNTER — Ambulatory Visit: Payer: Self-pay | Admitting: Cardiology

## 2017-01-29 ENCOUNTER — Telehealth (HOSPITAL_COMMUNITY): Payer: Self-pay | Admitting: Cardiology

## 2017-01-29 NOTE — Telephone Encounter (Signed)
User: Trina Ao A Date/time: 01/26/2017 2:12 PM  Comment: Called pt and lmsg for him to CB to sch echo.Edmonia Caprio  Context: Cadence Schedule Orders/Appt Requests Outcome: Left Message  Phone number: [phone] Phone Type: Home Phone  Comm. type: Telephone Call type: Outgoing  Contact: [name] Relation to patient: Self  Letter:       Patient was also contacted on 9/6  Patient was cx on 8/13 and n/s 8/31.   He will be removed from the workqueue.

## 2017-02-01 MED FILL — TRIAMCINOLONE 0.5% OINTMENT: 0.5 | 30 days supply | Qty: 60 | Fill #1

## 2017-02-25 NOTE — Progress Notes (Deleted)
Cardiology Office Note   Date:  02/25/2017   ID:  Christian Horton, DOB [birth year], MRN [id number]  PCP:  Loletta Specter, PA-C  Cardiologist:  Dr. Anne Fu    No chief complaint on file.     History of Present Illness: Christian Horton is a 51 y.o. male who presents for his hx of NICM.  He has a hx of Ef of 35% with mild LVH and G2DD, moderate MR, LA moderately dilated.  He underwent cardiac cath with CAD though non obstructive, 70% of apical LAD, and porx and mid RCA and 60% stenosis involving medium caliber, 1st diab branch.    He was placed on coreg 25 mg BID after titration,ACE and is on statin.  Was to have echo.  meds Diet Exercise     Past Medical History:  Diagnosis Date  . Allergy   . Asthma   . CAD (coronary artery disease) 08/13/2016  . Congestive heart failure (HCC) 08/08/2016  . Essential hypertension 02/18/2015  . Hilar adenopathy 08/09/2016  . Hyperlipidemia 08/13/2016    Past Surgical History:  Procedure Laterality Date  . INTRAVASCULAR PRESSURE WIRE/FFR STUDY N/A 08/10/2016   Procedure: Intravascular Pressure Wire/FFR Study;  Surgeon: Yvonne Kendall, MD;  Location: [facility] INVASIVE CV LAB;  Service: Cardiovascular;  Laterality: N/A;  . LEFT HEART CATH AND CORONARY ANGIOGRAPHY N/A 08/10/2016   Procedure: Left Heart Cath and Coronary Angiography;  Surgeon: Yvonne Kendall, MD;  Location: [facility] INVASIVE CV LAB;  Service: Cardiovascular;  Laterality: N/A;  . NO PAST SURGERIES       Current Outpatient Prescriptions  Medication Sig Dispense Refill  . albuterol (PROVENTIL HFA;VENTOLIN HFA) 108 (90 Base) MCG/ACT inhaler Inhale 2 puffs into the lungs every 4 (four) hours as needed for wheezing or shortness of breath (cough, shortness of breath or wheezing.). 54 g 3  . atorvastatin (LIPITOR) 40 MG tablet Take 1 tablet (40 mg total) by mouth daily at 6 PM. 30 tablet 2  . carvedilol (COREG) 25 MG tablet Take 1 tablet (25 mg total) by mouth 2 (two) times daily. 60 tablet  6  . clopidogrel (PLAVIX) 75 MG tablet Take 1 tablet (75 mg total) by mouth daily. 30 tablet 2  . furosemide (LASIX) 40 MG tablet Take 1 tablet (40 mg total) by mouth daily. 30 tablet 3  . lisinopril (PRINIVIL,ZESTRIL) 20 MG tablet Take 1 tablet (20 mg total) by mouth daily. 30 tablet 2  . mometasone-formoterol (DULERA) 200-5 MCG/ACT AERO INHALE 2 PUFFS INTO THE LUNGS 2 TIMES DAILY. 39 Inhaler 3  . nitroGLYCERIN (NITROSTAT) 0.4 MG SL tablet Place 1 tablet (0.4 mg total) under the tongue every 5 (five) minutes as needed for chest pain. 50 tablet 3  . predniSONE (STERAPRED UNI-PAK 21 TAB) 10 MG (21) TBPK tablet Use as directed on packet. 21 tablet 0  . senna-docusate (SENOKOT-S) 8.6-50 MG tablet Take 1 tablet by mouth at bedtime as needed for mild constipation. 20 tablet 0  . Skin Protectants, Misc. (EUCERIN) cream Apply topically as needed for dry skin. 454 g 0  . triamcinolone ointment (KENALOG) 0.5 % Apply 1 application topically 2 (two) times daily. 60 g 5   No current facility-administered medications for this visit.     Allergies:   Fish allergy; Aspirin; Peanut-containing drug products; Penicillins; and Powder    Social History:  The patient  reports that he quit smoking about 23 years ago. His smoking use included Cigarettes. He has a 8.50 pack-year smoking history. He has never  used smokeless tobacco. He reports that he uses drugs, including Marijuana. He reports that he does not drink alcohol.   Family History:  The patient's ***family history includes Asthma in his mother and sister; Cancer in his father; Obesity in his sister; Stroke in his father.    ROS:  General:no colds or fevers, no weight changes Skin:no rashes or ulcers HEENT:no blurred vision, no congestion CV:see HPI PUL:see HPI GI:no diarrhea constipation or melena, no indigestion GU:no hematuria, no dysuria MS:no joint pain, no claudication Neuro:no syncope, no lightheadedness Endo:no diabetes, no thyroid  disease Wt Readings from Last 3 Encounters:  10/30/16 190 lb (86.2 kg)  10/20/16 193 lb 3.2 oz (87.6 kg)  09/04/16 203 lb 12.8 oz (92.4 kg)     PHYSICAL EXAM: VS:  There were no vitals taken for this visit. , BMI There is no height or weight on file to calculate BMI. General:Pleasant affect, NAD Skin:Warm and dry, brisk capillary refill HEENT:normocephalic, sclera clear, mucus membranes moist Neck:supple, no JVD, no bruits  Heart:S1S2 RRR without murmur, gallup, rub or click Lungs:clear without rales, rhonchi, or wheezes YYT:KPTW, non tender, + BS, do not palpate liver spleen or masses Ext:no lower ext edema, 2+ pedal pulses, 2+ radial pulses Neuro:alert and oriented, MAE, follows commands, + facial symmetry    EKG:  EKG is ordered today. The ekg ordered today demonstrates ***   Recent Labs: 08/07/2016: B Natriuretic Peptide 1,301.7 08/10/2016: Magnesium 2.0 08/11/2016: Hemoglobin 15.7; Platelets 220 08/16/2016: ALT 29; BUN 13; Creatinine, Ser 1.08; Potassium 4.1; Sodium 141    Lipid Panel    Component Value Date/Time   CHOL 236 (H) 08/10/2016 0434   TRIG 129 08/10/2016 0434   HDL 74 08/10/2016 0434   CHOLHDL 3.2 08/10/2016 0434   VLDL 26 08/10/2016 0434   LDLCALC 136 (H) 08/10/2016 0434       Other studies Reviewed: Additional studies/ records that were reviewed today include: ***.   ASSESSMENT AND PLAN:  1.  NICM  2. CAD  3.  HTN  4.  HLD   Current medicines are reviewed with the patient today.  The patient Has no concerns regarding medicines.  The following changes have been made:  See above Labs/ tests ordered today include:see above  Disposition:   FU:  see above  Signed, Nada Boozer, NP  02/25/2017 9:14 PM    [facility] [address] [place], [place], [place]  [address]/ [address] [place], [place] Phone: [phone]; Fax: [phone]  [phone]

## 2017-02-26 ENCOUNTER — Ambulatory Visit: Payer: Self-pay | Admitting: Cardiology

## 2017-03-01 MED FILL — TRIAMCINOLONE 0.5% OINTMENT: 0.5 | 30 days supply | Qty: 60 | Fill #2

## 2017-05-25 MED FILL — TRIAMCINOLONE 0.5% OINTMENT: 0.5 | 30 days supply | Qty: 60 | Fill #3

## 2017-07-09 MED FILL — TRIAMCINOLONE 0.5% OINTMENT: 0.5 | 30 days supply | Qty: 60 | Fill #4

## 2017-10-10 MED FILL — TRIAMCINOLONE 0.5% OINTMENT: 0.5 | 30 days supply | Qty: 60 | Fill #5

## 2017-12-17 ENCOUNTER — Other Ambulatory Visit (INDEPENDENT_AMBULATORY_CARE_PROVIDER_SITE_OTHER): Payer: Self-pay | Admitting: Physician Assistant

## 2017-12-17 DIAGNOSIS — L309 Dermatitis, unspecified: Secondary | ICD-10-CM

## 2017-12-17 NOTE — Telephone Encounter (Signed)
FWD to PCP. Christian Horton, CMA  

## 2017-12-18 ENCOUNTER — Telehealth (INDEPENDENT_AMBULATORY_CARE_PROVIDER_SITE_OTHER): Payer: Self-pay | Admitting: Physician Assistant

## 2017-12-18 ENCOUNTER — Other Ambulatory Visit (INDEPENDENT_AMBULATORY_CARE_PROVIDER_SITE_OTHER): Payer: Self-pay | Admitting: Physician Assistant

## 2017-12-18 DIAGNOSIS — J4540 Moderate persistent asthma, uncomplicated: Secondary | ICD-10-CM

## 2017-12-18 MED ORDER — MOMETASONE FURO-FORMOTEROL FUM 200-5 MCG/ACT IN AERO: INHALATION_SPRAY | RESPIRATORY_TRACT | 5 refills | Status: DC

## 2017-12-18 NOTE — Telephone Encounter (Signed)
Patient called requesting medication refill for mometasone-formoterol (DULERA) 200-5 MCG/ACT AERO  triamcinolone ointment (KENALOG) 0.5 %  Patient uses [facility] - [place], [place] - [address]   Please Advice [phone]  Thank you .Louisa Second

## 2017-12-18 NOTE — Telephone Encounter (Signed)
Sent. Please notify.

## 2017-12-19 NOTE — Telephone Encounter (Signed)
Left message notifying that dulera was sent to pharmacy and appointment is needed for refill of triamcinolone. Maryjean Morn, CMA

## 2018-01-24 ENCOUNTER — Encounter (INDEPENDENT_AMBULATORY_CARE_PROVIDER_SITE_OTHER): Payer: Self-pay | Admitting: Physician Assistant

## 2018-01-24 ENCOUNTER — Other Ambulatory Visit: Payer: Self-pay

## 2018-01-24 ENCOUNTER — Ambulatory Visit (INDEPENDENT_AMBULATORY_CARE_PROVIDER_SITE_OTHER): Payer: Medicaid Other | Admitting: Physician Assistant

## 2018-01-24 VITALS — BP 163/83 | HR 64 | Temp 97.6°F | Ht 73.0 in | Wt 190.8 lb

## 2018-01-24 DIAGNOSIS — I10 Essential (primary) hypertension: Secondary | ICD-10-CM | POA: Diagnosis not present

## 2018-01-24 DIAGNOSIS — Z9119 Patient's noncompliance with other medical treatment and regimen: Secondary | ICD-10-CM | POA: Diagnosis not present

## 2018-01-24 DIAGNOSIS — R400 Somnolence: Secondary | ICD-10-CM | POA: Diagnosis not present

## 2018-01-24 DIAGNOSIS — Z23 Encounter for immunization: Secondary | ICD-10-CM | POA: Diagnosis not present

## 2018-01-24 DIAGNOSIS — R0683 Snoring: Secondary | ICD-10-CM

## 2018-01-24 DIAGNOSIS — Z1211 Encounter for screening for malignant neoplasm of colon: Secondary | ICD-10-CM

## 2018-01-24 DIAGNOSIS — Z91199 Patient's noncompliance with other medical treatment and regimen due to unspecified reason: Secondary | ICD-10-CM

## 2018-01-24 DIAGNOSIS — L309 Dermatitis, unspecified: Secondary | ICD-10-CM

## 2018-01-24 MED FILL — DULERA 200 MCG/5 MCG INH: 200-5 | 30 days supply | Qty: 13 | Fill #0

## 2018-01-24 NOTE — Patient Instructions (Signed)
Use Cera-Ve or Aveeno as long as they contain ceramides.  Eczema Eczema is a broad term for a group of skin conditions that cause skin to become rough and inflamed. Each type of eczema has different triggers, symptoms, and treatments. Eczema of any type is usually itchy and symptoms range from mild to severe. Eczema and its symptoms are not spread from person to person (are not contagious). It can appear on different parts of the body at different times. Your eczema may not look the same as someone else's eczema. What are the types of eczema? Atopic dermatitis This is a long-term (chronic) skin disease that keeps coming back (recurring). Usual symptoms are dry skin and small, solid pimples that may swell and leak fluid (weep). Contact dermatitis This happens when something irritates the skin and causes a rash. The irritation can come from substances that you are allergic to (allergens), such as poison ivy, chemicals, or medicines that were applied to your skin. Dyshidrotic eczema This is a form of eczema on the hands and feet. It shows up as very itchy, fluid-filled blisters. It can affect people of any age, but is more common before age 34. Hand eczema This causes very itchy areas of skin on the palms and sides of the hands and fingers. This type of eczema is common in industrial jobs where you may be exposed to many different types of irritants. Lichen simplex chronicus This type of eczema occurs when a person constantly scratches one area of the body. Repeated scratching of the area leads to thickened skin (lichenification). Lichen simplex chronicus can occur along with other types of eczema. It is more common in adults, but may be seen in children as well. Nummular eczema This is a common type of eczema. It has no known cause. It typically causes a red, circular, crusty lesion (plaque) that may be itchy. Scratching may become a habit and can cause bleeding. Nummular eczema occurs most often in  people of middle-age or older. It most often affects the hands. Seborrheic dermatitis This is a common skin disease that mainly affects the scalp. It may also affect any oily areas of the body, such as the face, sides of nose, eyebrows, ears, eyelids, and chest. It is marked by small scaling and redness of the skin (erythema). This can affect people of all ages. In infants, this condition is known as Location manager." Stasis dermatitis This is a common skin disease that usually appears on the legs and feet. It most often occurs in people who have a condition that prevents blood from being pumped through the veins in the legs (chronic venous insufficiency). Stasis dermatitis is a chronic condition that needs long-term management. How is eczema diagnosed? Your health care provider will examine your skin and review your medical history. He or she may also give you skin patch tests. These tests involve taking patches that contain possible allergens and placing them on your back. He or she will then check in a few days to see if an allergic reaction occurred. What are the common treatments? Treatment for eczema is based on the type of eczema you have. Hydrocortisone steroid medicine can relieve itching quickly and help reduce inflammation. This medicine may be prescribed or obtained over-the-counter, depending on the strength of the medicine that is needed. Follow these instructions at home:  Take over-the-counter and prescription medicines only as told by your health care provider.  Use creams or ointments to moisturize your skin. Do not use lotions.  Learn  what triggers or irritates your symptoms. Avoid these things.  Treat symptom flare-ups quickly.  Do not itch your skin. This can make your rash worse.  Keep all follow-up visits as told by your health care provider. This is important. Where to find more information:  The American Academy of Dermatology: InfoExam.si  The National Eczema  Association: www.nationaleczema.org Contact a health care provider if:  You have serious itching, even with treatment.  You regularly scratch your skin until it bleeds.  Your rash looks different than usual.  Your skin is painful, swollen, or more red than usual.  You have a fever. Summary  There are eight general types of eczema. Each type has different triggers.  Eczema of any type causes itching that may range from mild to severe.  Treatment varies based on the type of eczema you have. Hydrocortisone steroid medicine can help with itching and inflammation.  Protecting your skin is the best way to prevent eczema. Use moisturizers and lotions. Avoid triggers and irritants, and treat flare-ups quickly. This information is not intended to replace advice given to you by your health care provider. Make sure you discuss any questions you have with your health care provider. Document Released: 09/21/2016 Document Revised: 09/21/2016 Document Reviewed: 09/21/2016 Elsevier Interactive Patient Education  2018 [employer].

## 2018-01-24 NOTE — Progress Notes (Signed)
Subjective:  Patient ID: Christian Horton, male    DOB: 06-20-1965  Age: 52 y.o. MRN: [id number]  CC: medication refill for kenalog  HPI Christian Horton is a 52 y.o. male with a PMH of CAD, CHF, HLD, HTN, and asthma presents for refill of kenalog for treatment of eczema. Would like a refill of kenalog cream. Eczema is widespread throughout body with sparing of the face. States he is taking long hot showers with application of a moisturizer after the shower. Has stopped smoking and drinking alcohol which have improved his eczema. Says he received light therapy from a dermatologist when he was in prison which seems to have worsened his eczema.     BP noted to be elevated today at 177/85 mmHg initially and 163/83 mmHg on retake. Last BP 120/81 mmHg on his last visit here 15 months ago while taking his antihypertensives (refer to note made on 10/30/16 for list of medications). Says he has not taken his anti-hypertensives since "I can't even remember". Says he does not want to restart anti-hypertensives. Rather try to control his sodium intake on his own. Says he does not believe he will get a heart attack or stroke. Also, has not seen cardiologist in 15 months. Did not f/u and have his echocardiogram done. Cardiologist had suspected hypertensive cardiomyopathy. Patient concerned about sleep apnea. Says he was observed to have stopped breathing during his sleep. He snores loudly. Often feels fatigued during the day. Stop-Bang score 7/8. Denies any other symptoms or complaints.      Outpatient Medications Prior to Visit  Medication Sig Dispense Refill  . mometasone-formoterol (DULERA) 200-5 MCG/ACT AERO INHALE 2 PUFFS INTO THE LUNGS 2 TIMES DAILY. 1 Inhaler 5  . albuterol (PROVENTIL HFA;VENTOLIN HFA) 108 (90 Base) MCG/ACT inhaler Inhale 2 puffs into the lungs every 4 (four) hours as needed for wheezing or shortness of breath (cough, shortness of breath or wheezing.). (Patient not taking: Reported on  01/24/2018) 54 g 3  . atorvastatin (LIPITOR) 40 MG tablet Take 1 tablet (40 mg total) by mouth daily at 6 PM. (Patient not taking: Reported on 01/24/2018) 30 tablet 2  . carvedilol (COREG) 25 MG tablet Take 1 tablet (25 mg total) by mouth 2 (two) times daily. (Patient not taking: Reported on 01/24/2018) 60 tablet 6  . clopidogrel (PLAVIX) 75 MG tablet Take 1 tablet (75 mg total) by mouth daily. (Patient not taking: Reported on 01/24/2018) 30 tablet 2  . furosemide (LASIX) 40 MG tablet Take 1 tablet (40 mg total) by mouth daily. 30 tablet 3  . lisinopril (PRINIVIL,ZESTRIL) 20 MG tablet Take 1 tablet (20 mg total) by mouth daily. (Patient not taking: Reported on 01/24/2018) 30 tablet 2  . nitroGLYCERIN (NITROSTAT) 0.4 MG SL tablet Place 1 tablet (0.4 mg total) under the tongue every 5 (five) minutes as needed for chest pain. (Patient not taking: Reported on 01/24/2018) 50 tablet 3  . triamcinolone ointment (KENALOG) 0.5 % Apply 1 application topically 2 (two) times daily. (Patient not taking: Reported on 01/24/2018) 60 g 5  . predniSONE (STERAPRED UNI-PAK 21 TAB) 10 MG (21) TBPK tablet Use as directed on packet. 21 tablet 0  . senna-docusate (SENOKOT-S) 8.6-50 MG tablet Take 1 tablet by mouth at bedtime as needed for mild constipation. 20 tablet 0  . Skin Protectants, Misc. (EUCERIN) cream Apply topically as needed for dry skin. 454 g 0   No facility-administered medications prior to visit.      ROS Review of Systems  Constitutional:  Negative for chills, fever and malaise/fatigue.  Eyes: Negative for blurred vision.  Respiratory: Negative for shortness of breath.   Cardiovascular: Negative for chest pain and palpitations.  Gastrointestinal: Negative for abdominal pain and nausea.  Genitourinary: Negative for dysuria and hematuria.  Musculoskeletal: Negative for joint pain and myalgias.  Skin: Negative for rash.       Dry and itchy skin  Neurological: Negative for tingling and headaches.   Psychiatric/Behavioral: Negative for depression. The patient is not nervous/anxious.     Objective:  BP (!) 177/85 (BP Location: Left Arm, Patient Position: Sitting, Cuff Size: Large)   Pulse (!) 59   Temp 97.6 F (36.4 C) (Oral)   Ht 6\' 1"  (1.854 m)   Wt 190 lb 12.8 oz (86.5 kg)   SpO2 99%   BMI 25.17 kg/m   BP/Weight 01/24/2018 10/30/2016 10/20/2016  Systolic BP 177 120 150  Diastolic BP 85 81 88  Wt. (Lbs) 190.8 190 193.2  BMI 25.17 25.07 25.49      Physical Exam  Constitutional: He is oriented to person, place, and time.  Well developed, well nourished, NAD, polite  HENT:  Head: Normocephalic and atraumatic.  Eyes: No scleral icterus.  Neck: Normal range of motion. Neck supple. No thyromegaly present.  Cardiovascular: Normal rate, regular rhythm and normal heart sounds.  Pulmonary/Chest: Effort normal and breath sounds normal.  Abdominal: Soft. Bowel sounds are normal. There is no tenderness.  Musculoskeletal: He exhibits no edema.  Neurological: He is alert and oriented to person, place, and time.  Skin: Skin is warm and dry. No erythema. No pallor.  Large patches of somewhat lichenified skin throughout body with sparing of the face.  Psychiatric: He has a normal mood and affect. His behavior is normal. Thought content normal.  Vitals reviewed.    Assessment & Plan:    1. Hypertension, unspecified type - Nocturnal polysomnography (NPSG); Future - Comprehensive metabolic panel; Future - Lipid panel; Future  2. Daytime somnolence - Nocturnal polysomnography (NPSG); Future - Stop-Bang score 7/8.  3. Snoring - Nocturnal polysomnography (NPSG); Future - Stop-Bang score 7/8.  4. Noncompliance - Does not want to take anti-hypertensives. I have educated patient on the risk of MI and stroke. I have strongly advised he take anti-hypertensives to lower his cardiovascular risks.  5. Need for Tdap vaccination - Tdap vaccine greater than or equal to 7yo IM  6.  Special screening for malignant neoplasms, colon - Fecal occult blood, imunochemical  7. Eczema, unspecified type - Ambulatory referral to Dermatology - I have advised patient to stop taking long hot showers and to apply moisturizer cream with ceramides right after a single lukewarm shower per day.   Follow-up: Return in about 4 weeks (around 02/21/2018) for HTN.   Loletta Specter PA

## 2018-01-29 ENCOUNTER — Other Ambulatory Visit (INDEPENDENT_AMBULATORY_CARE_PROVIDER_SITE_OTHER): Payer: Self-pay | Admitting: Physician Assistant

## 2018-01-29 DIAGNOSIS — L309 Dermatitis, unspecified: Secondary | ICD-10-CM

## 2018-01-29 NOTE — Telephone Encounter (Signed)
FWD to PCP. Tempestt S Roberts, CMA  

## 2018-02-07 MED FILL — TRIAMCINOLONE 0.5% OINTMENT: 0.5 | 30 days supply | Qty: 60 | Fill #0

## 2018-02-21 ENCOUNTER — Ambulatory Visit (INDEPENDENT_AMBULATORY_CARE_PROVIDER_SITE_OTHER): Payer: Medicaid Other | Admitting: Physician Assistant

## 2018-03-21 ENCOUNTER — Ambulatory Visit (INDEPENDENT_AMBULATORY_CARE_PROVIDER_SITE_OTHER): Payer: Medicaid Other | Admitting: Physician Assistant

## 2018-03-25 MED FILL — DULERA 200 MCG/5 MCG INH: 200-5 | 30 days supply | Qty: 13 | Fill #1

## 2018-04-08 ENCOUNTER — Other Ambulatory Visit: Payer: Self-pay

## 2018-04-08 ENCOUNTER — Encounter (INDEPENDENT_AMBULATORY_CARE_PROVIDER_SITE_OTHER): Payer: Self-pay | Admitting: Physician Assistant

## 2018-04-08 ENCOUNTER — Ambulatory Visit (INDEPENDENT_AMBULATORY_CARE_PROVIDER_SITE_OTHER): Payer: Medicaid Other | Admitting: Physician Assistant

## 2018-04-08 VITALS — BP 160/90 | HR 95 | Temp 97.5°F | Ht 73.0 in | Wt 189.2 lb

## 2018-04-08 DIAGNOSIS — I25118 Atherosclerotic heart disease of native coronary artery with other forms of angina pectoris: Secondary | ICD-10-CM

## 2018-04-08 DIAGNOSIS — I10 Essential (primary) hypertension: Secondary | ICD-10-CM

## 2018-04-08 DIAGNOSIS — J4540 Moderate persistent asthma, uncomplicated: Secondary | ICD-10-CM | POA: Diagnosis not present

## 2018-04-08 DIAGNOSIS — L309 Dermatitis, unspecified: Secondary | ICD-10-CM | POA: Diagnosis not present

## 2018-04-08 DIAGNOSIS — Z760 Encounter for issue of repeat prescription: Secondary | ICD-10-CM

## 2018-04-08 MED ORDER — NITROGLYCERIN 0.4 MG SL SUBL: 0.4000 mg | SUBLINGUAL_TABLET | SUBLINGUAL | 3 refills | Status: DC | PRN

## 2018-04-08 MED ORDER — ALBUTEROL SULFATE HFA 108 (90 BASE) MCG/ACT IN AERS: 2.0000 | INHALATION_SPRAY | RESPIRATORY_TRACT | 3 refills | Status: AC | PRN

## 2018-04-08 MED ORDER — MOMETASONE FURO-FORMOTEROL FUM 200-5 MCG/ACT IN AERO: INHALATION_SPRAY | RESPIRATORY_TRACT | 5 refills | Status: AC

## 2018-04-08 MED ORDER — ATORVASTATIN CALCIUM 40 MG PO TABS: 40.0000 mg | ORAL_TABLET | Freq: Every day | ORAL | 2 refills | Status: AC

## 2018-04-08 MED ORDER — TRIAMCINOLONE ACETONIDE 0.5 % EX OINT: TOPICAL_OINTMENT | CUTANEOUS | 0 refills | Status: DC

## 2018-04-08 MED ORDER — LISINOPRIL 20 MG PO TABS: 20.0000 mg | ORAL_TABLET | Freq: Every day | ORAL | 6 refills | Status: DC

## 2018-04-08 MED ORDER — CARVEDILOL 25 MG PO TABS: 25.0000 mg | ORAL_TABLET | Freq: Two times a day (BID) | ORAL | 6 refills | Status: DC

## 2018-04-08 MED ORDER — CLOPIDOGREL BISULFATE 75 MG PO TABS: 75.0000 mg | ORAL_TABLET | Freq: Every day | ORAL | 6 refills | Status: AC

## 2018-04-08 MED FILL — PROAIR HFA 90 MCG INHALER: 108 (90 BAS | 25 days supply | Qty: 9 | Fill #0

## 2018-04-08 MED FILL — ATORVASTATIN 40 MG TABLET: 40 | 30 days supply | Qty: 30 | Fill #0

## 2018-04-08 MED FILL — TRIAMCINOLONE 0.5% OINTMENT: 0.5 | 14 days supply | Qty: 60 | Fill #0

## 2018-04-08 MED FILL — CLOPIDOGREL 75 MG TABLET: 75 | 30 days supply | Qty: 30 | Fill #0

## 2018-04-08 MED FILL — CARVEDILOL 25 MG TABLET: 25 | 30 days supply | Qty: 60 | Fill #0

## 2018-04-08 MED FILL — NITROGLYCERIN 0.4 MG TAB SL: 0.4 | 10 days supply | Qty: 25 | Fill #0

## 2018-04-08 MED FILL — LISINOPRIL 20 MG TAB: 20 | 30 days supply | Qty: 30 | Fill #0

## 2018-04-08 NOTE — Progress Notes (Signed)
Subjective:  Patient ID: Christian Horton, male    DOB: 03/09/1966  Age: 52 y.o. MRN: [id number]  CC: f/u HTN   HPI Christian Horton a 52 y.o.malewith a PMH of CAD, CHF, HLD, HTN, and asthma presents to f/u on HTN. Last BP noted to be elevated at 177/85 mmHg initially then 163/83 mmHg on retake 10 weeks ago. Pt advised to return in four weeks but failed to do so. Labs to include CMP, Lipid panel, and FIT were ordered but patient failed to return for blood draw and failed to return FIT. Pt was advised to take anti-hypertensives as he was found to be noncompliant. Continues to be noncompliant and "hates taking pills". However, patient is considering taking anti-hypertensives. Does not endorse any other symptoms or complaints.     Outpatient Medications Prior to Visit  Medication Sig Dispense Refill  . mometasone-formoterol (DULERA) 200-5 MCG/ACT AERO INHALE 2 PUFFS INTO THE LUNGS 2 TIMES DAILY. 1 Inhaler 5  . triamcinolone ointment (KENALOG) 0.5 % APPLY 1 APPLICATION TOPICALLY 2 TIMES DAILY. 60 g 0  . albuterol (PROVENTIL HFA;VENTOLIN HFA) 108 (90 Base) MCG/ACT inhaler Inhale 2 puffs into the lungs every 4 (four) hours as needed for wheezing or shortness of breath (cough, shortness of breath or wheezing.). (Patient not taking: Reported on 04/08/2018) 54 g 3  . atorvastatin (LIPITOR) 40 MG tablet Take 1 tablet (40 mg total) by mouth daily at 6 PM. (Patient not taking: Reported on 01/24/2018) 30 tablet 2  . carvedilol (COREG) 25 MG tablet Take 1 tablet (25 mg total) by mouth 2 (two) times daily. (Patient not taking: Reported on 01/24/2018) 60 tablet 6  . clopidogrel (PLAVIX) 75 MG tablet Take 1 tablet (75 mg total) by mouth daily. (Patient not taking: Reported on 01/24/2018) 30 tablet 2  . furosemide (LASIX) 40 MG tablet Take 1 tablet (40 mg total) by mouth daily. 30 tablet 3  . lisinopril (PRINIVIL,ZESTRIL) 20 MG tablet Take 1 tablet (20 mg total) by mouth daily. (Patient not taking: Reported on  01/24/2018) 30 tablet 2  . nitroGLYCERIN (NITROSTAT) 0.4 MG SL tablet Place 1 tablet (0.4 mg total) under the tongue every 5 (five) minutes as needed for chest pain. (Patient not taking: Reported on 01/24/2018) 50 tablet 3   No facility-administered medications prior to visit.      ROS Review of Systems  Constitutional: Negative for chills, fever and malaise/fatigue.  Eyes: Negative for blurred vision.  Respiratory: Negative for shortness of breath.   Cardiovascular: Negative for chest pain and palpitations.  Gastrointestinal: Negative for abdominal pain and nausea.  Genitourinary: Negative for dysuria and hematuria.  Musculoskeletal: Negative for joint pain and myalgias.  Skin: Negative for rash.  Neurological: Negative for tingling and headaches.  Psychiatric/Behavioral: Negative for depression. The patient is not nervous/anxious.     Objective:  BP (!) 146/89 (BP Location: Left Arm, Patient Position: Sitting, Cuff Size: Large)   Pulse 95   Temp (!) 97.5 F (36.4 C) (Oral)   Ht 6\' 1"  (1.854 m)   Wt 189 lb 3.2 oz (85.8 kg)   SpO2 99%   BMI 24.96 kg/m   BP/Weight 04/08/2018 01/24/2018 10/30/2016  Systolic BP 146 163 120  Diastolic BP 89 83 81  Wt. (Lbs) 189.2 190.8 190  BMI 24.96 25.17 25.07      Physical Exam  Constitutional: He is oriented to person, place, and time.  Well developed, well nourished, NAD, polite  HENT:  Head: Normocephalic and atraumatic.  Eyes: No  scleral icterus.  Neck: Normal range of motion. Neck supple. No thyromegaly present.  Cardiovascular: Normal rate, regular rhythm and normal heart sounds.  No LE edema bilaterally.  Pulmonary/Chest: Effort normal and breath sounds normal.  Musculoskeletal: He exhibits no edema.  Neurological: He is alert and oriented to person, place, and time.  Skin: Skin is warm and dry. No rash noted. No erythema. No pallor.  Psychiatric: He has a normal mood and affect. His behavior is normal. Thought content normal.   Vitals reviewed.    Assessment & Plan:    1. Essential hypertension - lisinopril (PRINIVIL,ZESTRIL) 20 MG tablet; Take 1 tablet (20 mg total) by mouth daily.  Dispense: 30 tablet; Refill: 6 - carvedilol (COREG) 25 MG tablet; Take 1 tablet (25 mg total) by mouth 2 (two) times daily.  Dispense: 60 tablet; Refill: 6  2. Coronary artery disease with stable angina pectoris, unspecified vessel or lesion type, unspecified whether native or transplanted heart (HCC) - nitroGLYCERIN (NITROSTAT) 0.4 MG SL tablet; Place 1 tablet (0.4 mg total) under the tongue every 5 (five) minutes as needed for chest pain.  Dispense: 50 tablet; Refill: 3  3. Moderate persistent chronic asthma without complication - mometasone-formoterol (DULERA) 200-5 MCG/ACT AERO; INHALE 2 PUFFS INTO THE LUNGS 2 TIMES DAILY.  Dispense: 1 Inhaler; Refill: 5  4. Eczema, unspecified type - triamcinolone ointment (KENALOG) 0.5 %; APPLY 1 APPLICATION TOPICALLY 2 TIMES DAILY. Do not use for longer than 14 consecutive days.  Dispense: 60 g; Refill: 0  5. Medication refill - clopidogrel (PLAVIX) 75 MG tablet; Take 1 tablet (75 mg total) by mouth daily.  Dispense: 30 tablet; Refill: 6 - atorvastatin (LIPITOR) 40 MG tablet; Take 1 tablet (40 mg total) by mouth daily at 6 PM.  Dispense: 30 tablet; Refill: 2 - albuterol (PROVENTIL HFA;VENTOLIN HFA) 108 (90 Base) MCG/ACT inhaler; Inhale 2 puffs into the lungs every 4 (four) hours as needed for wheezing or shortness of breath (cough, shortness of breath or wheezing.).  Dispense: 54 g; Refill: 3   Meds ordered this encounter  Medications  . lisinopril (PRINIVIL,ZESTRIL) 20 MG tablet    Sig: Take 1 tablet (20 mg total) by mouth daily.    Dispense:  30 tablet    Refill:  6    Order Specific Question:   Supervising Provider    Answer:   Hoy Register [[id number]]  . clopidogrel (PLAVIX) 75 MG tablet    Sig: Take 1 tablet (75 mg total) by mouth daily.    Dispense:  30 tablet    Refill:  6     Order Specific Question:   Supervising Provider    Answer:   Hoy Register [[id number]]  . carvedilol (COREG) 25 MG tablet    Sig: Take 1 tablet (25 mg total) by mouth 2 (two) times daily.    Dispense:  60 tablet    Refill:  6    DOSE INCREASE    Order Specific Question:   Supervising Provider    Answer:   Hoy Register [[id number]]  . atorvastatin (LIPITOR) 40 MG tablet    Sig: Take 1 tablet (40 mg total) by mouth daily at 6 PM.    Dispense:  30 tablet    Refill:  2    Order Specific Question:   Supervising Provider    Answer:   Hoy Register [[id number]]  . albuterol (PROVENTIL HFA;VENTOLIN HFA) 108 (90 Base) MCG/ACT inhaler    Sig: Inhale 2 puffs into the lungs every 4 (four)  hours as needed for wheezing or shortness of breath (cough, shortness of breath or wheezing.).    Dispense:  54 g    Refill:  3    Order Specific Question:   Supervising Provider    Answer:   Hoy Register [[id number]]  . nitroGLYCERIN (NITROSTAT) 0.4 MG SL tablet    Sig: Place 1 tablet (0.4 mg total) under the tongue every 5 (five) minutes as needed for chest pain.    Dispense:  50 tablet    Refill:  3    Order Specific Question:   Supervising Provider    Answer:   Hoy Register [[id number]]  . mometasone-formoterol (DULERA) 200-5 MCG/ACT AERO    Sig: INHALE 2 PUFFS INTO THE LUNGS 2 TIMES DAILY.    Dispense:  1 Inhaler    Refill:  5    Order Specific Question:   Supervising Provider    Answer:   Hoy Register [[id number]]  . triamcinolone ointment (KENALOG) 0.5 %    Sig: APPLY 1 APPLICATION TOPICALLY 2 TIMES DAILY. Do not use for longer than 14 consecutive days.    Dispense:  60 g    Refill:  0    Order Specific Question:   Supervising Provider    Answer:   Hoy Register [[id number]]    Follow-up: Return in about 4 weeks (around 05/06/2018) for HTN.   Loletta Specter PA

## 2018-04-08 NOTE — Patient Instructions (Signed)

## 2018-04-09 ENCOUNTER — Ambulatory Visit (HOSPITAL_BASED_OUTPATIENT_CLINIC_OR_DEPARTMENT_OTHER): Payer: Medicaid Other | Attending: Physician Assistant | Admitting: Internal Medicine

## 2018-04-09 VITALS — Ht 72.0 in | Wt 185.0 lb

## 2018-04-09 DIAGNOSIS — R400 Somnolence: Secondary | ICD-10-CM | POA: Insufficient documentation

## 2018-04-09 DIAGNOSIS — I10 Essential (primary) hypertension: Secondary | ICD-10-CM | POA: Diagnosis not present

## 2018-04-09 DIAGNOSIS — R0683 Snoring: Secondary | ICD-10-CM | POA: Diagnosis not present

## 2018-05-05 DIAGNOSIS — I10 Essential (primary) hypertension: Secondary | ICD-10-CM

## 2018-05-05 NOTE — Procedures (Signed)
   Patient Name: Christian Horton, Christian Horton Date: 04/09/2018 Gender: Male D.O.B: [birth year] Age (years): 51 Referring Provider: Loletta Specter PA Height (inches): 72 Interpreting Physician: Jetty Duhamel MD, ABSM Weight (lbs): 185 RPSGT: Ulyess Mort BMI: 25 MRN: [id number] Neck Size: 15.00  CLINICAL INFORMATION Sleep Study Type: NPSG Indication for sleep study: Daytime Fatigue, Fatigue, Hypertension, Snoring Epworth Sleepiness Score: 15  SLEEP STUDY TECHNIQUE As per the AASM Manual for the Scoring of Sleep and Associated Events v2.3 (April 2016) with a hypopnea requiring 4% desaturations.  The channels recorded and monitored were frontal, central and occipital EEG, electrooculogram (EOG), submentalis EMG (chin), nasal and oral airflow, thoracic and abdominal wall motion, anterior tibialis EMG, snore microphone, electrocardiogram, and pulse oximetry.  MEDICATIONS Medications self-administered by patient taken the night of the study : none reported  SLEEP ARCHITECTURE The study was initiated at 10:05:40 PM and ended at 4:25:54 AM.  Sleep onset time was 4.1 minutes and the sleep efficiency was 91.3%%. The total sleep time was 347 minutes.  Stage REM latency was 43.5 minutes.  The patient spent 9.8%% of the night in stage N1 sleep, 63.8%% in stage N2 sleep, 0.0%% in stage N3 and 26.4% in REM.  Alpha intrusion was absent.  Supine sleep was 43.80%.  RESPIRATORY PARAMETERS The overall apnea/hypopnea index (AHI) was 0.7 per hour. There were 0 total apneas, including 0 obstructive, 0 central and 0 mixed apneas. There were 4 hypopneas and 13 RERAs.  The AHI during Stage REM sleep was 0.7 per hour.  AHI while supine was 1.2 per hour.  The mean oxygen saturation was 95.8%. The minimum SpO2 during sleep was 90.0%.  moderate snoring was noted during this study.  CARDIAC DATA The 2 lead EKG demonstrated sinus rhythm. The mean heart rate was 62.2 beats per minute. Other EKG  findings include: PVCs.  LEG MOVEMENT DATA The total PLMS were 0 with a resulting PLMS index of 0.0. Associated arousal with leg movement index was 3.6 .  IMPRESSIONS - No significant obstructive sleep apnea occurred during this study (AHI = 0.7/h). - No significant central sleep apnea occurred during this study (CAI = 0.0/h). - The patient had minimal or no oxygen desaturation during the study (Min O2 = 90.0%) - The patient snored with moderate snoring volume. - EKG findings include PVCs. - Clinically significant periodic limb movements did not occur during sleep. No significant associated arousals.  DIAGNOSIS - Primary Snoring  RECOMMENDATIONS - Be careful with alcohol, sedatives and other CNS depressants that may worsen sleep apnea and disrupt normal sleep architecture. - Sleep hygiene should be reviewed to assess factors that may improve sleep quality. - Weight management and regular exercise should be initiated or continued if appropriate.  [Electronically signed] 05/05/2018 10:32 AM  Jetty Duhamel MD, ABSM Diplomate, American Board of Sleep Medicine   NPI: [id number]                          Jetty Duhamel Diplomate, American Board of Sleep Medicine  ELECTRONICALLY SIGNED ON:  05/05/2018, 10:31 AM [facility] PH: [phone]   FX: [phone] ACCREDITED BY THE AMERICAN ACADEMY OF SLEEP MEDICINE

## 2018-05-06 ENCOUNTER — Telehealth (INDEPENDENT_AMBULATORY_CARE_PROVIDER_SITE_OTHER): Payer: Self-pay

## 2018-05-06 ENCOUNTER — Other Ambulatory Visit: Payer: Self-pay

## 2018-05-06 ENCOUNTER — Ambulatory Visit (INDEPENDENT_AMBULATORY_CARE_PROVIDER_SITE_OTHER): Payer: Medicaid Other | Admitting: Physician Assistant

## 2018-05-06 ENCOUNTER — Encounter (INDEPENDENT_AMBULATORY_CARE_PROVIDER_SITE_OTHER): Payer: Self-pay | Admitting: Physician Assistant

## 2018-05-06 VITALS — BP 151/80 | HR 69 | Temp 97.9°F | Ht 72.0 in | Wt 191.4 lb

## 2018-05-06 DIAGNOSIS — I10 Essential (primary) hypertension: Secondary | ICD-10-CM | POA: Diagnosis not present

## 2018-05-06 DIAGNOSIS — Z9119 Patient's noncompliance with other medical treatment and regimen: Secondary | ICD-10-CM | POA: Diagnosis not present

## 2018-05-06 DIAGNOSIS — L309 Dermatitis, unspecified: Secondary | ICD-10-CM | POA: Diagnosis not present

## 2018-05-06 DIAGNOSIS — Z91199 Patient's noncompliance with other medical treatment and regimen due to unspecified reason: Secondary | ICD-10-CM

## 2018-05-06 DIAGNOSIS — Z1211 Encounter for screening for malignant neoplasm of colon: Secondary | ICD-10-CM | POA: Diagnosis not present

## 2018-05-06 MED ORDER — AMLODIPINE BESYLATE 5 MG PO TABS: 5.0000 mg | ORAL_TABLET | Freq: Every day | ORAL | 3 refills | Status: DC

## 2018-05-06 MED ORDER — LISINOPRIL 40 MG PO TABS: 40.0000 mg | ORAL_TABLET | Freq: Every day | ORAL | 3 refills | Status: AC

## 2018-05-06 MED ORDER — CARVEDILOL 25 MG PO TABS: 25.0000 mg | ORAL_TABLET | Freq: Two times a day (BID) | ORAL | 3 refills | Status: AC

## 2018-05-06 MED ORDER — TRIAMCINOLONE ACETONIDE 0.5 % EX OINT: TOPICAL_OINTMENT | CUTANEOUS | 0 refills | Status: AC

## 2018-05-06 MED FILL — AMLODIPINE BESYLATE 5 MG TA: 5 | 30 days supply | Qty: 30 | Fill #0

## 2018-05-06 MED FILL — CARVEDILOL 25 MG TABLET: 25 | 30 days supply | Qty: 60 | Fill #0

## 2018-05-06 MED FILL — TRIAMCINOLONE 0.5% OINTMENT: 0.5 | 30 days supply | Qty: 60 | Fill #0

## 2018-05-06 MED FILL — LISINOPRIL 40 MG TABLET: 40 | 30 days supply | Qty: 30 | Fill #0

## 2018-05-06 NOTE — Telephone Encounter (Signed)
Patient is aware that no sleep apnea only snoring. Maryjean Morn, CMA

## 2018-05-06 NOTE — Progress Notes (Signed)
Subjective:  Patient ID: Christian Horton, male    DOB: 1966/02/17  Age: 52 y.o. MRN: [id number]  CC: f/u HTN  HPI  Christian Horton a 52 y.o.malewith a PMH of CAD, CHF, HLD, HTN, and asthma presents for HTN f/u. Last BP 163/83 mmHg one month ago. Pt had been noncompliant with his medications because he "hates taking pills'. Advised to taking Lisinopril 20 mg and carvedilol 25 mg and says he is taking medications for the most part. May forget to take his medications "every once in a while". Says he is interested in keeping healthy but can't do anything about his age and any blood pressure problems associated with age. Does not endorse CP, palpitations, SOB, HA, tingling, numbness, abdominal pain, f/c/n/v, swelling, or GI/GU sxs.     Pt asks about his dermatology referral for his eczema. Says he has not heard from the dermatology office.     Outpatient Medications Prior to Visit  Medication Sig Dispense Refill  . albuterol (PROVENTIL HFA;VENTOLIN HFA) 108 (90 Base) MCG/ACT inhaler Inhale 2 puffs into the lungs every 4 (four) hours as needed for wheezing or shortness of breath (cough, shortness of breath or wheezing.). 54 g 3  . atorvastatin (LIPITOR) 40 MG tablet Take 1 tablet (40 mg total) by mouth daily at 6 PM. 30 tablet 2  . carvedilol (COREG) 25 MG tablet Take 1 tablet (25 mg total) by mouth 2 (two) times daily. 60 tablet 6  . clopidogrel (PLAVIX) 75 MG tablet Take 1 tablet (75 mg total) by mouth daily. 30 tablet 6  . lisinopril (PRINIVIL,ZESTRIL) 20 MG tablet Take 1 tablet (20 mg total) by mouth daily. 30 tablet 6  . mometasone-formoterol (DULERA) 200-5 MCG/ACT AERO INHALE 2 PUFFS INTO THE LUNGS 2 TIMES DAILY. 1 Inhaler 5  . triamcinolone ointment (KENALOG) 0.5 % APPLY 1 APPLICATION TOPICALLY 2 TIMES DAILY. Do not use for longer than 14 consecutive days. 60 g 0  . nitroGLYCERIN (NITROSTAT) 0.4 MG SL tablet Place 1 tablet (0.4 mg total) under the tongue every 5 (five) minutes as  needed for chest pain. (Patient not taking: Reported on 05/06/2018) 50 tablet 3   No facility-administered medications prior to visit.      ROS Review of Systems  Constitutional: Negative for chills, fever and malaise/fatigue.  Eyes: Negative for blurred vision.  Respiratory: Negative for shortness of breath.   Cardiovascular: Negative for chest pain and palpitations.  Gastrointestinal: Negative for abdominal pain and nausea.  Genitourinary: Negative for dysuria and hematuria.  Musculoskeletal: Negative for joint pain and myalgias.  Skin: Positive for rash.  Neurological: Negative for tingling and headaches.  Psychiatric/Behavioral: Negative for depression. The patient is not nervous/anxious.     Objective:  BP (!) 151/80 (BP Location: Left Arm, Patient Position: Sitting, Cuff Size: Large)   Pulse 69   Temp 97.9 F (36.6 C) (Oral)   Ht 6' (1.829 m)   Wt 191 lb 6.4 oz (86.8 kg)   SpO2 97%   BMI 25.96 kg/m   BP/Weight 05/06/2018 04/09/2018 04/08/2018  Systolic BP 151 - 160  Diastolic BP 80 - 90  Wt. (Lbs) 191.4 185 189.2  BMI 25.96 25.09 24.96      Physical Exam Vitals signs reviewed.  Constitutional:      Comments: Well developed, well nourished, NAD, polite  HENT:     Head: Normocephalic and atraumatic.  Eyes:     General: No scleral icterus. Neck:     Musculoskeletal: Normal range of motion and  neck supple.     Thyroid: No thyromegaly.  Cardiovascular:     Rate and Rhythm: Normal rate and regular rhythm.     Heart sounds: Normal heart sounds.  Pulmonary:     Effort: Pulmonary effort is normal.     Breath sounds: Normal breath sounds.  Skin:    General: Skin is warm.     Comments: Thickened patches of skin on the LE bilaterally, skin on knees appears lichenified.   Neurological:     Mental Status: He is alert and oriented to person, place, and time.  Psychiatric:        Mood and Affect: Mood normal.        Behavior: Behavior normal.        Thought  Content: Thought content normal.      Assessment & Plan:   1. Essential hypertension - Increase lisinopril (PRINIVIL,ZESTRIL) 40 MG tablet; Take 1 tablet (40 mg total) by mouth daily.  Dispense: 30 tablet; Refill: 3 - Refill carvedilol (COREG) 25 MG tablet; Take 1 tablet (25 mg total) by mouth 2 (two) times daily.  Dispense: 60 tablet; Refill: 3 - Begin amLODipine (NORVASC) 5 MG tablet; Take 1 tablet (5 mg total) by mouth daily.  Dispense: 30 tablet; Refill: 3 - Lipid panel - Comprehensive metabolic panel  2. Eczema, unspecified type - Refill triamcinolone ointment (KENALOG) 0.5 %; APPLY 1 APPLICATION TOPICALLY 2 TIMES DAILY. Do not use for longer than 14 consecutive days.  Dispense: 60 g; Refill: 0 - Pt had already been referred to dermatology and was called twice according to [facility]. We called [facility] and had them reschedule the patient. Pt given appointment details.   3. Screening for colon cancer - Fecal occult blood, imunochemical  4. Noncompliance - Pt advised to complete testing (FIT) as directed, take medications as directed, and to attend appointments when made.    Meds ordered this encounter  Medications  . lisinopril (PRINIVIL,ZESTRIL) 40 MG tablet    Sig: Take 1 tablet (40 mg total) by mouth daily.    Dispense:  30 tablet    Refill:  3    Order Specific Question:   Supervising Provider    Answer:   Hoy Register [[id number]]  . amLODipine (NORVASC) 5 MG tablet    Sig: Take 1 tablet (5 mg total) by mouth daily.    Dispense:  30 tablet    Refill:  3    Order Specific Question:   Supervising Provider    Answer:   Hoy Register [[id number]]  . triamcinolone ointment (KENALOG) 0.5 %    Sig: APPLY 1 APPLICATION TOPICALLY 2 TIMES DAILY. Do not use for longer than 14 consecutive days.    Dispense:  60 g    Refill:  0    Order Specific Question:   Supervising Provider    Answer:   Hoy Register [[id number]]  . carvedilol (COREG) 25 MG tablet    Sig: Take  1 tablet (25 mg total) by mouth 2 (two) times daily.    Dispense:  60 tablet    Refill:  3    DOSE INCREASE    Order Specific Question:   Supervising Provider    Answer:   Hoy Register [[id number]]    Follow-up: Return in about 4 weeks (around 06/03/2018) for nurse visit BP.   Loletta Specter PA

## 2018-05-06 NOTE — Telephone Encounter (Signed)
-----   Message from Loletta Specter, PA-C sent at 05/06/2018  9:42 AM EST ----- No sleep apnea, just snoring.

## 2018-05-06 NOTE — Patient Instructions (Signed)
DASH Eating Plan DASH stands for "Dietary Approaches to Stop Hypertension." The DASH eating plan is a healthy eating plan that has been shown to reduce high blood pressure (hypertension). It may also reduce your risk for type 2 diabetes, heart disease, and stroke. The DASH eating plan may also help with weight loss. What are tips for following this plan? General guidelines  Avoid eating more than 2,300 mg (milligrams) of salt (sodium) a day. If you have hypertension, you may need to reduce your sodium intake to 1,500 mg a day.  Limit alcohol intake to no more than 1 drink a day for nonpregnant women and 2 drinks a day for men. One drink equals 12 oz of beer, 5 oz of wine, or 1 oz of hard liquor.  Work with your health care provider to maintain a healthy body weight or to lose weight. Ask what an ideal weight is for you.  Get at least 30 minutes of exercise that causes your heart to beat faster (aerobic exercise) most days of the week. Activities may include walking, swimming, or biking.  Work with your health care provider or diet and nutrition specialist (dietitian) to adjust your eating plan to your individual calorie needs. Reading food labels  Check food labels for the amount of sodium per serving. Choose foods with less than 5 percent of the Daily Value of sodium. Generally, foods with less than 300 mg of sodium per serving fit into this eating plan.  To find whole grains, look for the word "whole" as the first word in the ingredient list. Shopping  Buy products labeled as "low-sodium" or "no salt added."  Buy fresh foods. Avoid canned foods and premade or frozen meals. Cooking  Avoid adding salt when cooking. Use salt-free seasonings or herbs instead of table salt or sea salt. Check with your health care provider or pharmacist before using salt substitutes.  Do not fry foods. Cook foods using healthy methods such as baking, boiling, grilling, and broiling instead.  Cook with  heart-healthy oils, such as olive, canola, soybean, or sunflower oil. Meal planning   Eat a balanced diet that includes: ? 5 or more servings of fruits and vegetables each day. At each meal, try to fill half of your plate with fruits and vegetables. ? Up to 6-8 servings of whole grains each day. ? Less than 6 oz of lean meat, poultry, or fish each day. A 3-oz serving of meat is about the same size as a deck of cards. One egg equals 1 oz. ? 2 servings of low-fat dairy each day. ? A serving of nuts, seeds, or beans 5 times each week. ? Heart-healthy fats. Healthy fats called Omega-3 fatty acids are found in foods such as flaxseeds and coldwater fish, like sardines, salmon, and mackerel.  Limit how much you eat of the following: ? Canned or prepackaged foods. ? Food that is high in trans fat, such as fried foods. ? Food that is high in saturated fat, such as fatty meat. ? Sweets, desserts, sugary drinks, and other foods with added sugar. ? Full-fat dairy products.  Do not salt foods before eating.  Try to eat at least 2 vegetarian meals each week.  Eat more home-cooked food and less restaurant, buffet, and fast food.  When eating at a restaurant, ask that your food be prepared with less salt or no salt, if possible. What foods are recommended? The items listed may not be a complete list. Talk with your dietitian about what   dietary choices are best for you. Grains Whole-grain or whole-wheat bread. Whole-grain or whole-wheat pasta. Brown rice. Oatmeal. Quinoa. Bulgur. Whole-grain and low-sodium cereals. Pita bread. Low-fat, low-sodium crackers. Whole-wheat flour tortillas. Vegetables Fresh or frozen vegetables (raw, steamed, roasted, or grilled). Low-sodium or reduced-sodium tomato and vegetable juice. Low-sodium or reduced-sodium tomato sauce and tomato paste. Low-sodium or reduced-sodium canned vegetables. Fruits All fresh, dried, or frozen fruit. Canned fruit in natural juice (without  added sugar). Meat and other protein foods Skinless chicken or turkey. Ground chicken or turkey. Pork with fat trimmed off. Fish and seafood. Egg whites. Dried beans, peas, or lentils. Unsalted nuts, nut butters, and seeds. Unsalted canned beans. Lean cuts of beef with fat trimmed off. Low-sodium, lean deli meat. Dairy Low-fat (1%) or fat-free (skim) milk. Fat-free, low-fat, or reduced-fat cheeses. Nonfat, low-sodium ricotta or cottage cheese. Low-fat or nonfat yogurt. Low-fat, low-sodium cheese. Fats and oils Soft margarine without trans fats. Vegetable oil. Low-fat, reduced-fat, or light mayonnaise and salad dressings (reduced-sodium). Canola, safflower, olive, soybean, and sunflower oils. Avocado. Seasoning and other foods Herbs. Spices. Seasoning mixes without salt. Unsalted popcorn and pretzels. Fat-free sweets. What foods are not recommended? The items listed may not be a complete list. Talk with your dietitian about what dietary choices are best for you. Grains Baked goods made with fat, such as croissants, muffins, or some breads. Dry pasta or rice meal packs. Vegetables Creamed or fried vegetables. Vegetables in a cheese sauce. Regular canned vegetables (not low-sodium or reduced-sodium). Regular canned tomato sauce and paste (not low-sodium or reduced-sodium). Regular tomato and vegetable juice (not low-sodium or reduced-sodium). Pickles. Olives. Fruits Canned fruit in a light or heavy syrup. Fried fruit. Fruit in cream or butter sauce. Meat and other protein foods Fatty cuts of meat. Ribs. Fried meat. Bacon. Sausage. Bologna and other processed lunch meats. Salami. Fatback. Hotdogs. Bratwurst. Salted nuts and seeds. Canned beans with added salt. Canned or smoked fish. Whole eggs or egg yolks. Chicken or turkey with skin. Dairy Whole or 2% milk, cream, and half-and-half. Whole or full-fat cream cheese. Whole-fat or sweetened yogurt. Full-fat cheese. Nondairy creamers. Whipped toppings.  Processed cheese and cheese spreads. Fats and oils Butter. Stick margarine. Lard. Shortening. Ghee. Bacon fat. Tropical oils, such as coconut, palm kernel, or palm oil. Seasoning and other foods Salted popcorn and pretzels. Onion salt, garlic salt, seasoned salt, table salt, and sea salt. Worcestershire sauce. Tartar sauce. Barbecue sauce. Teriyaki sauce. Soy sauce, including reduced-sodium. Steak sauce. Canned and packaged gravies. Fish sauce. Oyster sauce. Cocktail sauce. Horseradish that you find on the shelf. Ketchup. Mustard. Meat flavorings and tenderizers. Bouillon cubes. Hot sauce and Tabasco sauce. Premade or packaged marinades. Premade or packaged taco seasonings. Relishes. Regular salad dressings. Where to find more information:  National Heart, Lung, and Blood Institute: www.nhlbi.nih.gov  American Heart Association: www.heart.org Summary  The DASH eating plan is a healthy eating plan that has been shown to reduce high blood pressure (hypertension). It may also reduce your risk for type 2 diabetes, heart disease, and stroke.  With the DASH eating plan, you should limit salt (sodium) intake to 2,300 mg a day. If you have hypertension, you may need to reduce your sodium intake to 1,500 mg a day.  When on the DASH eating plan, aim to eat more fresh fruits and vegetables, whole grains, lean proteins, low-fat dairy, and heart-healthy fats.  Work with your health care provider or diet and nutrition specialist (dietitian) to adjust your eating plan to your individual   calorie needs. This information is not intended to replace advice given to you by your health care provider. Make sure you discuss any questions you have with your health care provider. Document Released: 04/27/2011 Document Revised: 05/01/2016 Document Reviewed: 05/01/2016 Elsevier Interactive Patient Education  2018 Elsevier Inc.  

## 2018-05-07 ENCOUNTER — Telehealth (INDEPENDENT_AMBULATORY_CARE_PROVIDER_SITE_OTHER): Payer: Self-pay

## 2018-05-07 LAB — COMPREHENSIVE METABOLIC PANEL
ALBUMIN: 4.3 g/dL (ref 3.5–5.5)
ALT: 25 IU/L (ref 0–44)
AST: 34 IU/L (ref 0–40)
Albumin/Globulin Ratio: 1.9 (ref 1.2–2.2)
Alkaline Phosphatase: 70 IU/L (ref 39–117)
BUN/Creatinine Ratio: 11 (ref 9–20)
BUN: 11 mg/dL (ref 6–24)
Bilirubin Total: 0.6 mg/dL (ref 0.0–1.2)
CO2: 22 mmol/L (ref 20–29)
Calcium: 9.1 mg/dL (ref 8.7–10.2)
Chloride: 106 mmol/L (ref 96–106)
Creatinine, Ser: 0.99 mg/dL (ref 0.76–1.27)
GFR calc Af Amer: 99 mL/min/{1.73_m2} (ref >59)
GFR calc non Af Amer: 86 mL/min/{1.73_m2} (ref >59)
GLUCOSE: 113 mg/dL — AB (ref 65–99)
Globulin, Total: 2.3 g/dL (ref 1.5–4.5)
Potassium: 3.5 mmol/L (ref 3.5–5.2)
Sodium: 141 mmol/L (ref 134–144)
TOTAL PROTEIN: 6.6 g/dL (ref 6.0–8.5)

## 2018-05-07 LAB — LIPID PANEL
CHOL/HDL RATIO: 2.2 ratio (ref 0.0–5.0)
Cholesterol, Total: 157 mg/dL (ref 100–199)
HDL: 71 mg/dL (ref >39)
LDL Calculated: 65 mg/dL (ref 0–99)
Triglycerides: 107 mg/dL (ref 0–149)
VLDL Cholesterol Cal: 21 mg/dL (ref 5–40)

## 2018-05-07 NOTE — Telephone Encounter (Signed)
-----   Message from Roger David Gomez, PA-C sent at 05/07/2018  8:42 AM EST ----- Labs normal. 

## 2018-05-07 NOTE — Telephone Encounter (Signed)
Patient is aware of normal labs. Riel Hirschman S Shiron Whetsel, CMA  

## 2018-06-03 ENCOUNTER — Encounter (INDEPENDENT_AMBULATORY_CARE_PROVIDER_SITE_OTHER): Payer: Self-pay

## 2018-06-03 ENCOUNTER — Ambulatory Visit (INDEPENDENT_AMBULATORY_CARE_PROVIDER_SITE_OTHER): Payer: Medicaid Other

## 2018-06-03 VITALS — BP 177/87 | HR 61 | Temp 97.7°F

## 2018-06-03 DIAGNOSIS — Z0130 Encounter for examination of blood pressure without abnormal findings: Secondary | ICD-10-CM

## 2018-06-03 NOTE — Progress Notes (Signed)
Patient was advised to take medications as directed and schedule an office visit for hypertension in one to two weeks per Dr. Laural Benes. Maryjean Morn, CMA

## 2018-06-07 ENCOUNTER — Emergency Department (HOSPITAL_COMMUNITY)
Admission: EM | Admit: 2018-06-07 | Discharge: 2018-06-07 | Disposition: A | Discharge status: Home / Self Care | Payer: Medicaid Other | Attending: Emergency Medicine | Admitting: Emergency Medicine

## 2018-06-07 ENCOUNTER — Encounter (HOSPITAL_COMMUNITY): Payer: Self-pay

## 2018-06-07 ENCOUNTER — Emergency Department (HOSPITAL_COMMUNITY): Payer: Medicaid Other

## 2018-06-07 ENCOUNTER — Other Ambulatory Visit: Payer: Self-pay

## 2018-06-07 DIAGNOSIS — I10 Essential (primary) hypertension: Secondary | ICD-10-CM

## 2018-06-07 DIAGNOSIS — I110 Hypertensive heart disease with heart failure: Secondary | ICD-10-CM | POA: Diagnosis not present

## 2018-06-07 DIAGNOSIS — R51 Headache: Secondary | ICD-10-CM | POA: Insufficient documentation

## 2018-06-07 DIAGNOSIS — J45909 Unspecified asthma, uncomplicated: Secondary | ICD-10-CM | POA: Insufficient documentation

## 2018-06-07 DIAGNOSIS — R519 Headache, unspecified: Secondary | ICD-10-CM

## 2018-06-07 DIAGNOSIS — I5021 Acute systolic (congestive) heart failure: Secondary | ICD-10-CM | POA: Diagnosis not present

## 2018-06-07 LAB — I-STAT CHEM 8, ED
BUN: 15 mg/dL (ref 6–20)
Calcium, Ion: 1.2 mmol/L (ref 1.15–1.40)
Chloride: 101 mmol/L (ref 98–111)
Creatinine, Ser: 1 mg/dL (ref 0.61–1.24)
Glucose, Bld: 91 mg/dL (ref 70–99)
HEMATOCRIT: 44 % (ref 39.0–52.0)
Hemoglobin: 15 g/dL (ref 13.0–17.0)
Potassium: 3.6 mmol/L (ref 3.5–5.1)
Sodium: 140 mmol/L (ref 135–145)
TCO2: 31 mmol/L (ref 22–32)

## 2018-06-07 NOTE — ED Triage Notes (Signed)
Pt states he has a headache for the last 3 hours now its almost gone. No numbness or tingling.  No neuro deficits.

## 2018-06-07 NOTE — ED Provider Notes (Signed)
[facility] EMERGENCY DEPARTMENT Provider Note   CSN: [id number] Arrival date & time: 06/07/18  0005     History   Chief Complaint Chief Complaint  Patient presents with  . Headache    HPI Christian Horton is a 53 y.o. male.  The history is provided by the patient.  Headache  Pain location:  Generalized Quality:  Dull Severity currently:  8/10 Onset quality:  Gradual Duration:  12 hours Timing:  Constant Progression:  Improving Chronicity:  New Similar to prior headaches: yes     Past Medical History:  Diagnosis Date  . Allergy   . Asthma   . CAD (coronary artery disease) 08/13/2016  . Congestive heart failure (HCC) 08/08/2016  . Essential hypertension 02/18/2015  . Hilar adenopathy 08/09/2016  . Hyperlipidemia 08/13/2016    Patient Active Problem List   Diagnosis Date Noted  . CAD (coronary artery disease) 08/13/2016  . Constipation 08/13/2016  . Hyperlipidemia 08/13/2016  . Hilar adenopathy 08/09/2016  . Acute systolic CHF (congestive heart failure), NYHA class 3 (HCC)   . Precordial pain   . Dilated cardiomyopathy (HCC)   . Congestive heart failure (HCC) 08/08/2016  . Dyspnea 08/07/2016  . Mild intermittent asthma with exacerbation   . Atypical chest pain 02/18/2015  . Essential hypertension 02/18/2015  . Abnormal EKG 02/18/2015    Past Surgical History:  Procedure Laterality Date  . INTRAVASCULAR PRESSURE WIRE/FFR STUDY N/A 08/10/2016   Procedure: Intravascular Pressure Wire/FFR Study;  Surgeon: Yvonne Kendall, MD;  Location: [facility] INVASIVE CV LAB;  Service: Cardiovascular;  Laterality: N/A;  . LEFT HEART CATH AND CORONARY ANGIOGRAPHY N/A 08/10/2016   Procedure: Left Heart Cath and Coronary Angiography;  Surgeon: Yvonne Kendall, MD;  Location: [facility] INVASIVE CV LAB;  Service: Cardiovascular;  Laterality: N/A;  . NO PAST SURGERIES          Home Medications    Prior to Admission medications   Medication Sig Start Date End Date  Taking? Authorizing Provider  albuterol (PROVENTIL HFA;VENTOLIN HFA) 108 (90 Base) MCG/ACT inhaler Inhale 2 puffs into the lungs every 4 (four) hours as needed for wheezing or shortness of breath (cough, shortness of breath or wheezing.). 04/08/18   Loletta Specter, PA-C  amLODipine (NORVASC) 5 MG tablet Take 1 tablet (5 mg total) by mouth daily. 05/06/18   Loletta Specter, PA-C  atorvastatin (LIPITOR) 40 MG tablet Take 1 tablet (40 mg total) by mouth daily at 6 PM. 04/08/18   Loletta Specter, PA-C  carvedilol (COREG) 25 MG tablet Take 1 tablet (25 mg total) by mouth 2 (two) times daily. 05/06/18   Loletta Specter, PA-C  clopidogrel (PLAVIX) 75 MG tablet Take 1 tablet (75 mg total) by mouth daily. 04/08/18   Loletta Specter, PA-C  lisinopril (PRINIVIL,ZESTRIL) 40 MG tablet Take 1 tablet (40 mg total) by mouth daily. 05/06/18   Loletta Specter, PA-C  mometasone-formoterol (DULERA) 200-5 MCG/ACT AERO INHALE 2 PUFFS INTO THE LUNGS 2 TIMES DAILY. 04/08/18   Loletta Specter, PA-C  nitroGLYCERIN (NITROSTAT) 0.4 MG SL tablet Place 1 tablet (0.4 mg total) under the tongue every 5 (five) minutes as needed for chest pain. Patient not taking: Reported on 05/06/2018 04/08/18   Loletta Specter, PA-C  triamcinolone ointment (KENALOG) 0.5 % APPLY 1 APPLICATION TOPICALLY 2 TIMES DAILY. Do not use for longer than 14 consecutive days. 05/06/18   Loletta Specter, PA-C    Family History Family History  Problem Relation Age of  Onset  . Asthma Mother   . Stroke Father   . Cancer Father   . Asthma Sister   . Obesity Sister     Social History Social History   Tobacco Use  . Smoking status: Former Smoker    Packs/day: 0.50    Years: 17.00    Pack years: 8.50    Types: Cigarettes    Last attempt to quit: 1995    Years since quitting: 25.0  . Smokeless tobacco: Never Used  Substance Use Topics  . Alcohol use: No    Alcohol/week: 0.0 standard drinks  . Drug use: Yes    Types:  Marijuana     Allergies   Fish allergy; Aspirin; Peanut-containing drug products; Penicillins; and Powder   Review of Systems Review of Systems  Neurological: Positive for headaches.  All other systems reviewed and are negative.    Physical Exam Updated Vital Signs BP (!) 173/103 (BP Location: Right Arm)   Pulse 60   Temp 97.8 F (36.6 C) (Oral)   Resp 16   SpO2 100%   Physical Exam Vitals signs and nursing note reviewed.  Constitutional:      Appearance: He is well-developed.  HENT:     Head: Normocephalic and atraumatic.     Mouth/Throat:     Mouth: Mucous membranes are moist.  Eyes:     Extraocular Movements: Extraocular movements intact.     Pupils: Pupils are equal, round, and reactive to light.  Neck:     Musculoskeletal: Normal range of motion.  Cardiovascular:     Rate and Rhythm: Normal rate.  Pulmonary:     Effort: Pulmonary effort is normal. No respiratory distress.  Abdominal:     General: There is no distension.  Musculoskeletal: Normal range of motion.  Skin:    General: Skin is warm and dry.  Neurological:     Mental Status: He is alert.     Comments: No altered mental status, able to give full seemingly accurate history.  Face is symmetric, EOM's intact, pupils equal and reactive, vision intact, tongue and uvula midline without deviation. Upper and Lower extremity motor 5/5, intact pain perception in distal extremities, 2+ reflexes in biceps, patella and achilles tendons. Able to perform finger to nose normal with both hands. Walks without assistance or evident ataxia.        ED Treatments / Results  Labs (all labs ordered are listed, but only abnormal results are displayed) Labs Reviewed  I-STAT CHEM 8, ED    EKG None  Radiology Ct Head Wo Contrast  Result Date: 06/07/2018 CLINICAL DATA:  Diffuse severe headache which has improved EXAM: CT HEAD WITHOUT CONTRAST TECHNIQUE: Contiguous axial images were obtained from the base of the  skull through the vertex without intravenous contrast. COMPARISON:  None available FINDINGS: Brain: No evidence of acute infarction, hemorrhage, hydrocephalus, extra-axial collection or mass lesion/mass effect. Mild patchy low-density in the cerebral white matter, likely chronic small vessel ischemia given patient's vascular risk factors. Vascular: No hyperdense vessel or unexpected calcification. Skull: No acute finding.  Remote nasal arch fracture on the right. Sinuses/Orbits: Mild ethmoid mucosal thickening IMPRESSION: 1. No acute finding. 2. Mild chronic small vessel ischemia. Electronically Signed   By: Marnee Spring M.D.   On: 06/07/2018 07:24    Procedures Procedures (including critical care time)  Medications Ordered in ED Medications - No data to display   Initial Impression / Assessment and Plan / ED Course  I have reviewed the triage  vital signs and the nursing notes.  Pertinent labs & imaging results that were available during my care of the patient were reviewed by me and considered in my medical decision making (see chart for details).     Patient with a headache that had improved prior to my evaluation.  Patient said he thought he might of had some numbness to left side of his face as well.  Secondary to this was elevated blood pressure I did check a CT and labs make sure there is no evidence of endorgan damage and these were negative.  Offered to change the patient's blood pressure medications however he refused and wants to follow-up with his doctor for that.  Patient discharged in stable condition neurologically intact  Final Clinical Impressions(s) / ED Diagnoses   Final diagnoses:  Acute nonintractable headache, unspecified headache type  Hypertension, unspecified type    ED Discharge Orders    None       Wrenn Willcox, Barbara Cower, MD 06/07/18 2257

## 2018-06-07 NOTE — ED Notes (Signed)
Pt verbalized understanding of d/c instructions and has no further questions, VSS, NAD.  

## 2018-06-25 ENCOUNTER — Ambulatory Visit (INDEPENDENT_AMBULATORY_CARE_PROVIDER_SITE_OTHER): Payer: Medicaid Other | Admitting: Family Medicine

## 2018-06-25 MED FILL — CLOBETASOL PROPIONATE 0.05: 0.05 | 14 days supply | Qty: 60 | Fill #0

## 2018-08-08 MED FILL — PROAIR HFA 90 MCG INHALER: 108 (90 BAS | 75 days supply | Qty: 26 | Fill #1

## 2018-08-22 ENCOUNTER — Encounter (HOSPITAL_COMMUNITY): Payer: Self-pay

## 2018-08-22 ENCOUNTER — Ambulatory Visit (HOSPITAL_COMMUNITY)
Admission: EM | Admit: 2018-08-22 | Discharge: 2018-08-22 | Disposition: A | Discharge status: Home / Self Care | Payer: Medicaid Other | Attending: Family Medicine | Admitting: Family Medicine

## 2018-08-22 ENCOUNTER — Other Ambulatory Visit: Payer: Self-pay

## 2018-08-22 DIAGNOSIS — R030 Elevated blood-pressure reading, without diagnosis of hypertension: Secondary | ICD-10-CM

## 2018-08-22 DIAGNOSIS — J4521 Mild intermittent asthma with (acute) exacerbation: Secondary | ICD-10-CM | POA: Diagnosis not present

## 2018-08-22 DIAGNOSIS — J302 Other seasonal allergic rhinitis: Secondary | ICD-10-CM

## 2018-08-22 MED ORDER — PREDNISONE 20 MG PO TABS: 20.0000 mg | ORAL_TABLET | Freq: Two times a day (BID) | ORAL | 0 refills | Status: AC

## 2018-08-22 MED ORDER — PREDNISONE 20 MG PO TABS: 20.0000 mg | ORAL_TABLET | Freq: Two times a day (BID) | ORAL | 0 refills | Status: DC

## 2018-08-22 MED ORDER — FLUTICASONE PROPIONATE 50 MCG/ACT NA SUSP: 2.0000 | Freq: Every day | NASAL | 0 refills | Status: AC

## 2018-08-22 MED ORDER — CETIRIZINE HCL 10 MG PO TABS: 10.0000 mg | ORAL_TABLET | Freq: Every day | ORAL | 0 refills | Status: AC

## 2018-08-22 MED ORDER — CETIRIZINE HCL 10 MG PO TABS: 10.0000 mg | ORAL_TABLET | Freq: Every day | ORAL | 0 refills | Status: DC

## 2018-08-22 MED ORDER — FLUTICASONE PROPIONATE 50 MCG/ACT NA SUSP: 2.0000 | Freq: Every day | NASAL | 0 refills | Status: DC

## 2018-08-22 NOTE — ED Triage Notes (Signed)
Patient presents to Urgent Care with complaints of asthma exacerbation since the past few day. Patient states he has used his inhaler more than usual, has already used it 3 times today. pt's airway WLD, pt in NAD at this time.

## 2018-08-22 NOTE — ED Notes (Signed)
Patient verbalizes understanding of discharge instructions. Opportunity for questioning and answers were provided. Patient discharged from [facility] by RN.  

## 2018-08-22 NOTE — Discharge Instructions (Signed)
Get plenty of rest and push fluids Zyrtec prescribed for nasal congestion, runny nose, and/or sore throat Flonase prescribed for nasal congestion and runny nose Prednisone prescribed for asthma flare.  Take as directed and to completion Use inhaler as needed for shortness of breath and/or wheezing Follow up with Joaquin Courts FNP to establish care Return or go to ER if you have any new or worsening symptoms fever, chills, nausea, vomiting, chest pain, cough, shortness of breath, wheezing, abdominal pain, changes in bowel or bladder habits, etc...  Blood pressure elevated in office.  Please recheck in 24 hours.  If it continues to be greater than 140/90 please follow up with PCP for further evaluation and management.

## 2018-08-22 NOTE — ED Provider Notes (Signed)
[facility]   [id number] 08/22/18 Arrival Time: 1704   CC: Asthma flare  SUBJECTIVE: History from: patient.  Christian Horton is a 53 y.o. male hx significant for allergies, asthma, CAD, CHF, HTN, HLD, who presents with runny nose, nasal congestion, SOB, and wheezing x 1 week.  Denies sick exposure to COVID, flu or strep.  Denies recent travel. Has tried inhaler without relief.  Symptoms are made worse at night.  Reports previous symptoms in the past.   Denies fever, chills, fatigue, sinus pain, sore throat, chest pain, nausea, changes in bowel or bladder habits.   ROS: As per HPI.  Past Medical History:  Diagnosis Date  . Allergy   . Asthma   . CAD (coronary artery disease) 08/13/2016  . Congestive heart failure (HCC) 08/08/2016  . Essential hypertension 02/18/2015  . Hilar adenopathy 08/09/2016  . Hyperlipidemia 08/13/2016   Past Surgical History:  Procedure Laterality Date  . INTRAVASCULAR PRESSURE WIRE/FFR STUDY N/A 08/10/2016   Procedure: Intravascular Pressure Wire/FFR Study;  Surgeon: Yvonne Kendall, MD;  Location: [facility] INVASIVE CV LAB;  Service: Cardiovascular;  Laterality: N/A;  . LEFT HEART CATH AND CORONARY ANGIOGRAPHY N/A 08/10/2016   Procedure: Left Heart Cath and Coronary Angiography;  Surgeon: Yvonne Kendall, MD;  Location: [facility] INVASIVE CV LAB;  Service: Cardiovascular;  Laterality: N/A;  . NO PAST SURGERIES     Allergies  Allergen Reactions  . Fish Allergy Anaphylaxis  . Aspirin Itching and Rash  . Peanut-Containing Drug Products Itching  . Penicillins Swelling    Has patient had a PCN reaction causing immediate rash, facial/tongue/throat swelling, SOB or lightheadedness with hypotension: Unknown Has patient had a PCN reaction causing severe rash involving mucus membranes or skin necrosis: Unknown Has patient had a PCN reaction that required hospitalization Unknown Has patient had a PCN reaction occurring within the last 10 years: Unknown If all of the  above answers are "NO", then may proceed with Cephalosporin use.   . Powder     Powder in Latex gloves   No current facility-administered medications on file prior to encounter.    Current Outpatient Medications on File Prior to Encounter  Medication Sig Dispense Refill  . albuterol (PROVENTIL HFA;VENTOLIN HFA) 108 (90 Base) MCG/ACT inhaler Inhale 2 puffs into the lungs every 4 (four) hours as needed for wheezing or shortness of breath (cough, shortness of breath or wheezing.). 54 g 3  . atorvastatin (LIPITOR) 40 MG tablet Take 1 tablet (40 mg total) by mouth daily at 6 PM. 30 tablet 2  . carvedilol (COREG) 25 MG tablet Take 1 tablet (25 mg total) by mouth 2 (two) times daily. 60 tablet 3  . clopidogrel (PLAVIX) 75 MG tablet Take 1 tablet (75 mg total) by mouth daily. 30 tablet 6  . lisinopril (PRINIVIL,ZESTRIL) 40 MG tablet Take 1 tablet (40 mg total) by mouth daily. 30 tablet 3  . mometasone-formoterol (DULERA) 200-5 MCG/ACT AERO INHALE 2 PUFFS INTO THE LUNGS 2 TIMES DAILY. 1 Inhaler 5  . triamcinolone ointment (KENALOG) 0.5 % APPLY 1 APPLICATION TOPICALLY 2 TIMES DAILY. Do not use for longer than 14 consecutive days. 60 g 0   Social History   Socioeconomic History  . Marital status: Single    Spouse name: Not on file  . Number of children: Not on file  . Years of education: Not on file  . Highest education level: Not on file  Occupational History  . Not on file  Social Needs  . Financial resource strain:  Not on file  . Food insecurity:    Worry: Not on file    Inability: Not on file  . Transportation needs:    Medical: Not on file    Non-medical: Not on file  Tobacco Use  . Smoking status: Former Smoker    Packs/day: 0.50    Years: 17.00    Pack years: 8.50    Types: Cigarettes    Last attempt to quit: 1995    Years since quitting: 25.2  . Smokeless tobacco: Never Used  Substance and Sexual Activity  . Alcohol use: No    Alcohol/week: 0.0 standard drinks  . Drug use:  Yes    Types: Marijuana  . Sexual activity: Not on file  Lifestyle  . Physical activity:    Days per week: Not on file    Minutes per session: Not on file  . Stress: Not on file  Relationships  . Social connections:    Talks on phone: Not on file    Gets together: Not on file    Attends religious service: Not on file    Active member of club or organization: Not on file    Attends meetings of clubs or organizations: Not on file    Relationship status: Not on file  . Intimate partner violence:    Fear of current or ex partner: Not on file    Emotionally abused: Not on file    Physically abused: Not on file    Forced sexual activity: Not on file  Other Topics Concern  . Not on file  Social History Narrative  . Not on file   Family History  Problem Relation Age of Onset  . Asthma Mother   . Stroke Father   . Cancer Father   . Asthma Sister   . Obesity Sister     OBJECTIVE:  Vitals:   08/22/18 1734  BP: (!) 175/104  Pulse: 80  Temp: 98.2 F (36.8 C)  TempSrc: Oral  SpO2: 99%     General appearance: alert; appear chronically ill, but nontoxic; speaking in full sentences and tolerating own secretions HEENT: NCAT; Ears: EACs clear, TMs pearly gray; Eyes: PERRL.  EOM grossly intact. Nose: nares patent without rhinorrhea, Throat: oropharynx clear, tonsils non erythematous or enlarged, uvula midline  Neck: supple without LAD Lungs: unlabored respirations, symmetrical air entry; cough: absent; no respiratory distress; CTAB Heart: regular rate and rhythm.  Radial pulses 2+ symmetrical bilaterally Skin: warm and dry Psychological: alert and cooperative; normal mood and affect  ASSESSMENT & PLAN:  1. Mild intermittent asthma with exacerbation   2. Seasonal allergies   3. Elevated blood pressure reading     Meds ordered this encounter  Medications  . cetirizine (ZYRTEC) 10 MG tablet    Sig: Take 1 tablet (10 mg total) by mouth daily.    Dispense:  20 tablet     Refill:  0    Order Specific Question:   Supervising Provider    Answer:   Eustace Moore [[id number]]  . fluticasone (FLONASE) 50 MCG/ACT nasal spray    Sig: Place 2 sprays into both nostrils daily.    Dispense:  16 g    Refill:  0    Order Specific Question:   Supervising Provider    Answer:   Eustace Moore [[id number]]  . predniSONE (DELTASONE) 20 MG tablet    Sig: Take 1 tablet (20 mg total) by mouth 2 (two) times daily with a meal for 5 days.  Dispense:  10 tablet    Refill:  0    Order Specific Question:   Supervising Provider    Answer:   Eustace Moore [[id number]]    Get plenty of rest and push fluids Zyrtec prescribed for nasal congestion, runny nose, and/or sore throat Flonase prescribed for nasal congestion and runny nose Prednisone prescribed for asthma flare.  Take as directed and to completion Use inhaler as needed for shortness of breath and/or wheezing Follow up with Joaquin Courts FNP to establish care Return or go to ER if you have any new or worsening symptoms fever, chills, nausea, vomiting, chest pain, cough, shortness of breath, wheezing, abdominal pain, changes in bowel or bladder habits, etc...  Blood pressure elevated in office.  Please recheck in 24 hours.  If it continues to be greater than 140/90 please follow up with PCP for further evaluation and management.    Reviewed expectations re: course of current medical issues. Questions answered. Outlined signs and symptoms indicating need for more acute intervention. Patient verbalized understanding. After Visit Summary given.         Rennis Harding, PA-C 08/22/18 1756

## 2018-10-15 MED FILL — PROAIR HFA 90 MCG INHALER: 108 (90 BAS | 75 days supply | Qty: 26 | Fill #2

## 2018-11-06 ENCOUNTER — Ambulatory Visit (HOSPITAL_COMMUNITY)
Admission: EM | Admit: 2018-11-06 | Discharge: 2018-11-06 | Disposition: A | Discharge status: Home / Self Care | Payer: Medicaid Other | Attending: Internal Medicine | Admitting: Internal Medicine

## 2018-11-06 ENCOUNTER — Ambulatory Visit (INDEPENDENT_AMBULATORY_CARE_PROVIDER_SITE_OTHER): Payer: Medicaid Other

## 2018-11-06 ENCOUNTER — Other Ambulatory Visit: Payer: Self-pay

## 2018-11-06 ENCOUNTER — Encounter (HOSPITAL_COMMUNITY): Payer: Self-pay | Admitting: Emergency Medicine

## 2018-11-06 DIAGNOSIS — S8992XA Unspecified injury of left lower leg, initial encounter: Secondary | ICD-10-CM | POA: Diagnosis not present

## 2018-11-06 DIAGNOSIS — S79919A Unspecified injury of unspecified hip, initial encounter: Secondary | ICD-10-CM

## 2018-11-06 DIAGNOSIS — M25559 Pain in unspecified hip: Secondary | ICD-10-CM

## 2018-11-06 DIAGNOSIS — W19XXXA Unspecified fall, initial encounter: Secondary | ICD-10-CM | POA: Diagnosis not present

## 2018-11-06 DIAGNOSIS — M25552 Pain in left hip: Secondary | ICD-10-CM | POA: Diagnosis not present

## 2018-11-06 DIAGNOSIS — M7989 Other specified soft tissue disorders: Secondary | ICD-10-CM

## 2018-11-06 DIAGNOSIS — M79605 Pain in left leg: Secondary | ICD-10-CM | POA: Diagnosis not present

## 2018-11-06 MED ORDER — IBUPROFEN 600 MG PO TABS: 600.0000 mg | ORAL_TABLET | Freq: Four times a day (QID) | ORAL | 0 refills | Status: AC | PRN

## 2018-11-06 NOTE — ED Triage Notes (Signed)
Pt sts fall today at work with left knee and hip pain

## 2018-11-06 NOTE — Discharge Instructions (Signed)
Use anti-inflammatories for pain/swelling. You may take up to 600 mg Ibuprofen every 8 hours with food. You may supplement Ibuprofen with Tylenol [phone] every 8 hours.  Ice and elevate knee/leg Keep wounds clean and dry Follow up if developing increased pain swelling, signs of infection, weakness

## 2018-11-07 NOTE — ED Provider Notes (Signed)
[facility]    CSN: [id number] Arrival date & time: 11/06/18  1735     History   Chief Complaint Chief Complaint  Patient presents with  . Fall  . Leg Pain    HPI Christian Horton is a 53 y.o. male history of asthma, CAD, CHF, hypertension, hyperlipidemia, presenting today for evaluation of left leg pain and injury.  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  He fell and leg went through the gap approximately up to his hip.  Since he has had pain in his left knee and proximal leg.  He sustained some abrasions to his knee and posterior leg.  He denies numbness or tingling.  Has been weightbearing, but has been painful.  Denies numbness or tingling.  Has developed some swelling to his upper leg.  Denies previous fractures.  HPI  Past Medical History:  Diagnosis Date  . Allergy   . Asthma   . CAD (coronary artery disease) 08/13/2016  . Congestive heart failure (HCC) 08/08/2016  . Essential hypertension 02/18/2015  . Hilar adenopathy 08/09/2016  . Hyperlipidemia 08/13/2016    Patient Active Problem List   Diagnosis Date Noted  . CAD (coronary artery disease) 08/13/2016  . Constipation 08/13/2016  . Hyperlipidemia 08/13/2016  . Hilar adenopathy 08/09/2016  . Acute systolic CHF (congestive heart failure), NYHA class 3 (HCC)   . Precordial pain   . Dilated cardiomyopathy (HCC)   . Congestive heart failure (HCC) 08/08/2016  . Dyspnea 08/07/2016  . Mild intermittent asthma with exacerbation   . Atypical chest pain 02/18/2015  . Essential hypertension 02/18/2015  . Abnormal EKG 02/18/2015    Past Surgical History:  Procedure Laterality Date  . INTRAVASCULAR PRESSURE WIRE/FFR STUDY N/A 08/10/2016   Procedure: Intravascular Pressure Wire/FFR  Study;  Surgeon: Yvonne Kendallhristopher End, MD;  [facility];  Service: Cardiovascular;  Laterality: N/A;  . LEFT HEART CATH AND CORONARY ANGIOGRAPHY N/A 08/10/2016   Procedure: Left Heart Cath and Coronary Angiography;  Surgeon: Yvonne Kendallhristopher End, MD;  [facility];  Service: Cardiovascular;  Laterality: N/A;  . NO PAST SURGERIES         Home Medications    Prior to Admission medications   Medication Sig Start Date End Date Taking? Authorizing Provider  albuterol (PROVENTIL HFA;VENTOLIN HFA) 108 (90 Base) MCG/ACT inhaler Inhale 2 puffs into the lungs every 4 (four) hours as needed for wheezing or shortness of breath (cough, shortness of breath or wheezing.). 04/08/18   Loletta SpecterGomez, Roger David, PA-C  atorvastatin (LIPITOR) 40 MG tablet Take 1 tablet (40 mg total) by mouth daily at 6 PM. 04/08/18   Loletta SpecterGomez, Roger David, PA-C  carvedilol (COREG) 25 MG tablet Take 1 tablet (25 mg total) by mouth 2 (two) times daily. 05/06/18   Loletta SpecterGomez, Roger David, PA-C  cetirizine (ZYRTEC) 10 MG tablet Take 1 tablet (10 mg total) by mouth daily. 08/22/18   Wurst, [place], PA-C  clopidogrel (PLAVIX) 75 MG tablet Take 1 tablet (75 mg total) by mouth daily. 04/08/18   Loletta SpecterGomez, Roger David, PA-C  fluticasone [facility](FLONASE) 50 MCG/ACT nasal spray Place 2 sprays into both nostrils daily. 08/22/18   Wurst, [place], PA-C  ibuprofen (ADVIL) 600 MG tablet Take 1 tablet (600 mg  total) by mouth every 6 (six) hours as needed. 11/06/18   ,  C, PA-C  lisinopril (PRINIVIL,ZESTRIL) 40 MG tablet Take 1 tablet (40 mg total) by mouth daily. 05/06/18   Loletta SpecterGomez, Roger David, PA-C  mometasone-formoterol (DULERA) 200-5 MCG/ACT AERO INHALE 2 PUFFS INTO THE LUNGS 2 TIMES DAILY. 04/08/18   Loletta SpecterGomez, Roger David, PA-C  triamcinolone ointment (KENALOG) 0.5 % APPLY 1 APPLICATION TOPICALLY 2 TIMES DAILY. Do not use for longer than 14 consecutive days. 05/06/18   Loletta SpecterGomez, Roger David, PA-C    Family History Family History  Problem  Relation Age of Onset  . Asthma Mother   . Stroke Father   . Cancer Father   . Asthma Sister   . Obesity Sister     Social History Social History   Tobacco Use  . Smoking status: Former Smoker    Packs/day: 0.50    Years: 17.00    Pack years: 8.50    Types: Cigarettes    Quit date: 1995    Years since quitting: 25.4  . Smokeless tobacco: Never Used  Substance Use Topics  . Alcohol use: No    Alcohol/week: 0.0 standard drinks  . Drug use: Yes    Types: Marijuana     Allergies   Fish allergy, Aspirin, Peanut-containing drug products, Penicillins, and Powder   Review of Systems Review of Systems  Constitutional: Negative for fatigue and fever.  Eyes: Negative for redness, itching and visual disturbance.  Respiratory: Negative for shortness of breath.   Cardiovascular: Negative for chest pain and leg swelling.  Gastrointestinal: Negative for nausea and vomiting.  Musculoskeletal: Positive for arthralgias, gait problem, joint swelling and myalgias.  Skin: Positive for color change and wound. Negative for rash.  Neurological: Negative for dizziness, syncope, weakness, light-headedness and headaches.     Physical Exam Triage Vital Signs ED Triage Vitals [11/06/18 1751]  Enc Vitals Group     BP (!) 176/110     Pulse Rate 99     Resp 18     Temp 98.2 F (36.8 C)     Temp Source Oral     SpO2 100 %     Weight      Height      Head Circumference      Peak Flow      Pain Score 8     Pain Loc      Pain Edu?      Excl. in GC?    No data found.  Updated Vital Signs BP (!) 176/110 (BP Location: Left Arm)   Pulse 99   Temp 98.2 F (36.8 C) (Oral)   Resp 18   SpO2 100%   Visual Acuity Right Eye Distance:   Left Eye Distance:   Bilateral Distance:    Right Eye Near:   Left Eye Near:    Bilateral Near:     Physical Exam Vitals signs and nursing note reviewed.  Constitutional:      Appearance: He is well-developed.  HENT:     Head: Normocephalic and  atraumatic.  Eyes:     Conjunctiva/sclera: Conjunctivae normal.  Neck:     Musculoskeletal: Neck supple.  Cardiovascular:     Rate and Rhythm: Normal rate and regular rhythm.     Heart sounds: No murmur.  Pulmonary:     Effort: Pulmonary effort is normal. No respiratory distress.     Breath sounds: Normal breath sounds.  Abdominal:     Palpations: Abdomen is soft.  Tenderness: There is no abdominal tenderness.  Musculoskeletal:     Comments: Left leg: Small 1 cm superficial abrasion to posterior aspect of proximal posterior thigh, large area of swelling that feels hard to proximal lateral/posterior aspect of proximal thigh, tender to touch, nontender to palpation along bony prominences of hip along ASIS and into groin region.  Full active range of motion of hip without triggering any pain in this region.  Left knee: Superficial abrasions noted to medial aspect of knee, still mildly bleeding, moderate amount of swelling noted along medial aspect of knee as well, full active range of motion, nontender to palpation over patella and lateral joint line, mild tenderness to popliteal area, tenderness does not extend into calf or anterior tibia  No abnormality or tenderness noted at left ankle/foot, dorsalis pedis 2+  Skin:    General: Skin is warm and dry.  Neurological:     Mental Status: He is alert.      UC Treatments / Results  Labs (all labs ordered are listed, but only abnormal results are displayed) Labs Reviewed - No data to display  EKG None  Radiology Dg Knee Complete 4 Views Left  Result Date: 11/06/2018 CLINICAL DATA:  Pain status post fall EXAM: LEFT KNEE - COMPLETE 4+ VIEW COMPARISON:  None. FINDINGS: There is soft tissue swelling about the knee without evidence of a displaced fracture or dislocation. There is no significant joint effusion. The joint spaces are relatively well preserved. IMPRESSION: 1. No acute osseous abnormality. 2. Soft tissue swelling about the  knee. 3. No significant joint effusion. Electronically Signed   By: Katherine Mantle M.D.   On: 11/06/2018 19:27   Dg Hip Unilat With Pelvis 2-3 Views Left  Result Date: 11/06/2018 CLINICAL DATA:  Pain status post fall. EXAM: DG HIP (WITH OR WITHOUT PELVIS) 2-3V LEFT COMPARISON:  None. FINDINGS: There is no evidence of hip fracture or dislocation. There is no evidence of arthropathy or other focal bone abnormality. IMPRESSION: Negative. Electronically Signed   By: Katherine Mantle M.D.   On: 11/06/2018 19:26    Procedures Procedures (including critical care time)  Medications Ordered in UC Medications - No data to display  Initial Impression / Assessment and Plan / UC Course  I have reviewed the triage vital signs and the nursing notes.  Pertinent labs & imaging results that were available during my care of the patient were reviewed by me and considered in my medical decision making (see chart for details).    X-rays negative for bony abnormality.  Most likely sprain/contusions.  Has a large swelling to proximal left leg could be developing a hematoma in this area.  Advised to elevate leg rest and ice this area.  Tylenol and ibuprofen temporarily.  Continue to monitor for gradual resolution, follow-up if symptoms worsening or not improving.Discussed strict return precautions. Patient verbalized understanding and is agreeable with plan.  He has Plavix listed in his chart, but states that he has not been on this medicine.  Will proceed with ibuprofen.  Final Clinical Impressions(s) / UC Diagnoses   Final diagnoses:  Leg swelling  Hip pain  Left leg pain  Fall, initial encounter     Discharge Instructions     Use anti-inflammatories for pain/swelling. You may take up to 600 mg Ibuprofen every 8 hours with food. You may supplement Ibuprofen with Tylenol [phone] mg every 8 hours.  Ice and elevate knee/leg Keep wounds clean and dry Follow up if developing increased pain swelling,  signs of infection, weakness    ED Prescriptions    Medication Sig Dispense Auth. Provider   ibuprofen (ADVIL) 600 MG tablet Take 1 tablet (600 mg total) by mouth every 6 (six) hours as needed. 30 tablet , [place] C, PA-C     Controlled Substance Prescriptions [place] Controlled Substance Registry consulted? Not Applicable   Janith Lima, [place] 11/07/18 1330

## 2018-12-02 ENCOUNTER — Other Ambulatory Visit: Payer: Self-pay

## 2018-12-02 ENCOUNTER — Encounter (HOSPITAL_COMMUNITY): Payer: Self-pay

## 2018-12-02 ENCOUNTER — Emergency Department (HOSPITAL_COMMUNITY)
Admission: EM | Admit: 2018-12-02 | Discharge: 2018-12-03 | Disposition: A | Discharge status: Home / Self Care | Payer: Medicaid Other | Attending: Emergency Medicine | Admitting: Emergency Medicine

## 2018-12-02 DIAGNOSIS — B349 Viral infection, unspecified: Secondary | ICD-10-CM | POA: Diagnosis not present

## 2018-12-02 DIAGNOSIS — I2510 Atherosclerotic heart disease of native coronary artery without angina pectoris: Secondary | ICD-10-CM | POA: Diagnosis not present

## 2018-12-02 DIAGNOSIS — Z7902 Long term (current) use of antithrombotics/antiplatelets: Secondary | ICD-10-CM | POA: Diagnosis not present

## 2018-12-02 DIAGNOSIS — Z20828 Contact with and (suspected) exposure to other viral communicable diseases: Secondary | ICD-10-CM | POA: Insufficient documentation

## 2018-12-02 DIAGNOSIS — Z91010 Allergy to peanuts: Secondary | ICD-10-CM | POA: Diagnosis not present

## 2018-12-02 DIAGNOSIS — J45909 Unspecified asthma, uncomplicated: Secondary | ICD-10-CM | POA: Insufficient documentation

## 2018-12-02 DIAGNOSIS — R531 Weakness: Secondary | ICD-10-CM | POA: Diagnosis present

## 2018-12-02 DIAGNOSIS — I110 Hypertensive heart disease with heart failure: Secondary | ICD-10-CM | POA: Diagnosis not present

## 2018-12-02 DIAGNOSIS — Z79899 Other long term (current) drug therapy: Secondary | ICD-10-CM | POA: Insufficient documentation

## 2018-12-02 DIAGNOSIS — Z87891 Personal history of nicotine dependence: Secondary | ICD-10-CM | POA: Insufficient documentation

## 2018-12-02 DIAGNOSIS — I5020 Unspecified systolic (congestive) heart failure: Secondary | ICD-10-CM | POA: Diagnosis not present

## 2018-12-02 LAB — CBC
HCT: 40.6 % (ref 39.0–52.0)
Hemoglobin: 13.3 g/dL (ref 13.0–17.0)
MCH: 25.7 pg — ABNORMAL LOW (ref 26.0–34.0)
MCHC: 32.8 g/dL (ref 30.0–36.0)
MCV: 78.4 fL — ABNORMAL LOW (ref 80.0–100.0)
Platelets: 225 10*3/uL (ref 150–400)
RBC: 5.18 MIL/uL (ref 4.22–5.81)
RDW: 14 % (ref 11.5–15.5)
WBC: 10.6 10*3/uL — ABNORMAL HIGH (ref 4.0–10.5)
nRBC: 0 % (ref 0.0–0.2)

## 2018-12-02 LAB — BASIC METABOLIC PANEL
Anion gap: 10 (ref 5–15)
BUN: 8 mg/dL (ref 6–20)
CO2: 23 mmol/L (ref 22–32)
Calcium: 8.8 mg/dL — ABNORMAL LOW (ref 8.9–10.3)
Chloride: 103 mmol/L (ref 98–111)
Creatinine, Ser: 1.16 mg/dL (ref 0.61–1.24)
GFR calc Af Amer: >60 mL/min (ref >60)
GFR calc non Af Amer: >60 mL/min (ref >60)
Glucose, Bld: 113 mg/dL — ABNORMAL HIGH (ref 70–99)
Potassium: 3.3 mmol/L — ABNORMAL LOW (ref 3.5–5.1)
Sodium: 136 mmol/L (ref 135–145)

## 2018-12-02 MED ORDER — SODIUM CHLORIDE 0.9% FLUSH: 3.0000 mL | Freq: Once | INTRAVENOUS | Status: DC

## 2018-12-02 NOTE — ED Triage Notes (Signed)
Patient states weakness, fatigue, burning with urination that started yesterday. He reports one new sexual partner. Temp 100.3 in triage.

## 2018-12-03 ENCOUNTER — Emergency Department (HOSPITAL_COMMUNITY): Payer: Medicaid Other

## 2018-12-03 LAB — URINALYSIS, ROUTINE W REFLEX MICROSCOPIC
Bacteria, UA: NONE SEEN
Bilirubin Urine: NEGATIVE
Glucose, UA: NEGATIVE mg/dL
Hgb urine dipstick: NEGATIVE
Ketones, ur: NEGATIVE mg/dL
Nitrite: NEGATIVE
Protein, ur: 30 mg/dL — AB
Specific Gravity, Urine: 1.018 (ref 1.005–1.030)
pH: 8 (ref 5.0–8.0)

## 2018-12-03 LAB — GC/CHLAMYDIA PROBE AMP (~~LOC~~) NOT AT ARMC
Chlamydia: NEGATIVE
Neisseria Gonorrhea: NEGATIVE

## 2018-12-03 MED ORDER — IBUPROFEN 800 MG PO TABS
800.0000 mg | ORAL_TABLET | Freq: Once | ORAL | Status: AC
  Administered 2018-12-03: 800 mg via ORAL
  Filled 2018-12-03: qty 1

## 2018-12-03 NOTE — ED Provider Notes (Signed)
[facility] Provider Note   CSN: [id number] Arrival date & time: 12/02/18  1914    History   Chief Complaint Chief Complaint  Patient presents with  . Weakness    HPI Christian Horton is a 53 y.o. male.    53 year old male with a history of CAD, CHF, hypertension, dyslipidemia presents to the emergency department for evaluation of generalized weakness and fatigue.  States that symptoms have been present x2 days.  He has had intermittent chills with subjective fever.  Temperature 100.25F in triage.  Also noting some decreased urinary stream without frequency or urgency.  Has had some burning with urination as well.  Feels that it smells stronger than normal.  Triage note references one new sexual partner; however, patient states that he has only been sexually active with his fiance.  Denies taking any medications for his symptoms.  No known sick contacts.  Denies associated cough, SOB, congestion, V/D, hematuria, penile discharge, scrotal swelling, testicular tenderness, loss of smell or taste.  The history is provided by the patient. No language interpreter was used.  Weakness   Past Medical History:  Diagnosis Date  . Allergy   . Asthma   . CAD (coronary artery disease) 08/13/2016  . Congestive heart failure (HCC) 08/08/2016  . Essential hypertension 02/18/2015  . Hilar adenopathy 08/09/2016  . Hyperlipidemia 08/13/2016    Patient Active Problem List   Diagnosis Date Noted  . CAD (coronary artery disease) 08/13/2016  . Constipation 08/13/2016  . Hyperlipidemia 08/13/2016  . Hilar adenopathy 08/09/2016  . Acute systolic CHF (congestive heart failure), NYHA class 3 (HCC)   . Precordial pain   . Dilated cardiomyopathy (HCC)   . Congestive heart failure (HCC) 08/08/2016  . Dyspnea 08/07/2016  . Mild intermittent asthma with exacerbation   . Atypical chest pain 02/18/2015  . Essential hypertension 02/18/2015  . Abnormal EKG 02/18/2015     Past Surgical History:  Procedure Laterality Date  . INTRAVASCULAR PRESSURE WIRE/FFR STUDY N/A 08/10/2016   Procedure: Intravascular Pressure Wire/FFR Study;  Surgeon: Yvonne Kendallhristopher End, MD;  [facility];  Service: Cardiovascular;  Laterality: N/A;  . LEFT HEART CATH AND CORONARY ANGIOGRAPHY N/A 08/10/2016   Procedure: Left Heart Cath and Coronary Angiography;  Surgeon: Yvonne Kendallhristopher End, MD;  [facility];  Service: Cardiovascular;  Laterality: N/A;  . NO PAST SURGERIES          Home Medications    Prior to Admission medications   Medication Sig Start Date End Date Taking? Authorizing Provider  albuterol (PROVENTIL HFA;VENTOLIN HFA) 108 (90 Base) MCG/ACT inhaler Inhale 2 puffs into the lungs every 4 (four) hours as needed for wheezing or shortness of breath (cough, shortness of breath or wheezing.). 04/08/18  Yes Loletta SpecterGomez, Roger David, PA-C  atorvastatin (LIPITOR) 40 MG tablet Take 1 tablet (40 mg total) by mouth daily at 6 PM. 04/08/18  Yes Loletta SpecterGomez, Roger David, PA-C  carvedilol (COREG) 25 MG tablet Take 1 tablet (25 mg total) by mouth 2 (two) times daily. 05/06/18  Yes Loletta SpecterGomez, Roger David, PA-C  cetirizine (ZYRTEC) 10 MG tablet Take 1 tablet (10 mg total) by mouth daily. 08/22/18  Yes Wurst, GrenadaBrittany, PA-C  clopidogrel (PLAVIX) 75 MG tablet Take 1 tablet (75 mg total) by mouth daily. 04/08/18  Yes Loletta SpecterGomez, Roger David, PA-C  fluticasone [facility](FLONASE) 50 MCG/ACT nasal spray Place 2 sprays into both nostrils daily. 08/22/18  Yes Wurst, [place]ada[place]ittany, PA-C  ibuprofen (ADVIL) 600 MG tablet Take  1 tablet (600 mg total) by mouth every 6 (six) hours as needed. 11/06/18  Yes Wieters, Hallie C, PA-C  lisinopril (PRINIVIL,ZESTRIL) 40 MG tablet Take 1 tablet (40 mg total) by mouth daily. 05/06/18  Yes Loletta Specter, PA-C  mometasone-formoterol (DULERA) 200-5 MCG/ACT AERO INHALE 2 PUFFS INTO THE LUNGS 2 TIMES DAILY. Patient taking differently: Inhale 2 puffs into the lungs 2 (two)  times a day.  04/08/18  Yes Loletta Specter, PA-C  triamcinolone ointment (KENALOG) 0.5 % APPLY 1 APPLICATION TOPICALLY 2 TIMES DAILY. Do not use for longer than 14 consecutive days. Patient not taking: Reported on 12/03/2018 05/06/18   Loletta Specter, PA-C    Family History Family History  Problem Relation Age of Onset  . Asthma Mother   . Stroke Father   . Cancer Father   . Asthma Sister   . Obesity Sister     Social History Social History   Tobacco Use  . Smoking status: Former Smoker    Packs/day: 0.50    Years: 17.00    Pack years: 8.50    Types: Cigarettes    Quit date: 1995    Years since quitting: 25.5  . Smokeless tobacco: Never Used  Substance Use Topics  . Alcohol use: No    Alcohol/week: 0.0 standard drinks  . Drug use: Yes    Types: Marijuana     Allergies   Fish allergy, Aspirin, Peanut-containing drug products, Penicillins, and Powder   Review of Systems Review of Systems  Neurological: Positive for weakness.  Ten systems reviewed and are negative for acute change, except as noted in the HPI.    Physical Exam Updated Vital Signs BP 133/85 (BP Location: Right Arm)   Pulse 85   Temp 97.7 F (36.5 C) (Oral)   Resp 18   SpO2 94%   Physical Exam Vitals signs and nursing note reviewed.  Constitutional:      General: He is not in acute distress.    Appearance: He is well-developed. He is not diaphoretic.     Comments: Nontoxic appearing  HENT:     Head: Normocephalic and atraumatic.     Nose:     Comments: Nasal congestion.  Eyes:     General: No scleral icterus.    Conjunctiva/sclera: Conjunctivae normal.  Neck:     Musculoskeletal: Normal range of motion.  Cardiovascular:     Rate and Rhythm: Regular rhythm. Tachycardia present.     Pulses: Normal pulses.     Comments: Mild tachycardia Pulmonary:     Effort: Pulmonary effort is normal. No respiratory distress.     Breath sounds: No stridor.     Comments: Respirations even and  unlabored Genitourinary:    Comments: Exam chaperoned by Tresa Endo, RN. Normal rectal tone. Brown stool on DRE. There is no TTP when palpating prostate. Prostate not boggy or warm to touch.  Musculoskeletal: Normal range of motion.  Skin:    General: Skin is warm and dry.     Coloration: Skin is not pale.     Findings: No erythema.     Comments: Diffuse eczematous rash without secondary infection or cellulitis.  Neurological:     Mental Status: He is alert and oriented to person, place, and time.  Psychiatric:        Behavior: Behavior normal.      ED Treatments / Results  Labs (all labs ordered are listed, but only abnormal results are displayed) Labs Reviewed  BASIC METABOLIC PANEL -  Abnormal; Notable for the following components:      Result Value   Potassium 3.3 (*)    Glucose, Bld 113 (*)    Calcium 8.8 (*)    All other components within normal limits  CBC - Abnormal; Notable for the following components:   WBC 10.6 (*)    MCV 78.4 (*)    MCH 25.7 (*)    All other components within normal limits  URINALYSIS, ROUTINE W REFLEX MICROSCOPIC - Abnormal; Notable for the following components:   APPearance CLOUDY (*)    Protein, ur 30 (*)    Leukocytes,Ua LARGE (*)    All other components within normal limits  URINE CULTURE  NOVEL CORONAVIRUS, NAA (HOSPITAL ORDER, SEND-OUT TO REF LAB)  CBG MONITORING, ED  GC/CHLAMYDIA PROBE AMP ([place]) NOT AT [facility]    EKG EKG Interpretation  Date/Time:  Monday December 02 2018 19:37:10 EDT Ventricular Rate:  102 PR Interval:  162 QRS Duration: 104 QT Interval:  346 QTC Calculation: 450 R Axis:   -74 Text Interpretation:  Sinus tachycardia Left axis deviation Incomplete right bundle branch block Possible Anterior infarct , age undetermined Abnormal ECG Otherwise no significant change Confirmed by Addison Lank [id number]) on 12/03/2018 1:39:47 AM   Radiology Dg Chest 2 View  Result Date: 12/03/2018 CLINICAL DATA:  Fever and weakness  EXAM: CHEST - 2 VIEW COMPARISON:  08/07/2016 FINDINGS: No focal opacity or pleural effusion. Hyperinflated lungs. Stable cardiomediastinal silhouette. No pneumothorax. IMPRESSION: No active cardiopulmonary disease. Electronically Signed   By: Donavan Foil M.D.   On: 12/03/2018 03:01    Procedures Procedures (including critical care time)  Medications Ordered in ED Medications  sodium chloride flush (NS) 0.9 % injection 3 mL (has no administration in time range)  ibuprofen (ADVIL) tablet 800 mg (800 mg Oral Given 12/03/18 0234)     Initial Impression / Assessment and Plan / ED Course  I have reviewed the triage vital signs and the nursing notes.  Pertinent labs & imaging results that were available during my care of the patient were reviewed by me and considered in my medical decision making (see chart for details).        53 year old male presents to the emergency department for symptoms consistent with nonspecific viral illness.  Reports fatigue and malaise with intermittent chills.  Noted to have low-grade fever in triage which has improved with ibuprofen.  Vitals have also improved with antipyretics.  Notes some burning dysuria and decreased urinary stream.  Denies frequency and urgency.  His urinalysis today is more consistent with contamination.  Will send culture.  Denies any new sexual partners or concern for STDs.  Prostatitis was considered, but not boggy or tender on exam.  Patient with mild, nonspecific leukocytosis.  No significant electrolyte derangements.  Chest x-ray negative for acute cardiopulmonary abnormality.  The patient is feeling better on repeat assessment.  We will continue with outpatient supportive care with the use of Tylenol and/or ibuprofen.  Encouraged primary care follow-up for recheck with return if symptoms worsen.  Return precautions discussed and provided. Patient discharged in stable condition with no unaddressed concerns.  Christian Horton was evaluated  in Emergency Department on 12/03/2018 for the symptoms described in the history of present illness. He was evaluated in the context of the global COVID-19 pandemic, which necessitated consideration that the patient might be at risk for infection with the SARS-CoV-2 virus that causes COVID-19. Institutional protocols and algorithms that pertain to the evaluation of patients at  risk for COVID-19 are in a state of rapid change based on information released by regulatory bodies including the CDC and federal and state organizations. These policies and algorithms were followed during the patient's care in the ED.   Final Clinical Impressions(s) / ED Diagnoses   Final diagnoses:  Viral illness    ED Discharge Orders    None       Antony MaduraHumes, Rowan Blaker, PA-C 12/03/18 [id number]    Nira Connardama, Pedro Eduardo, MD 12/03/18 [phone]

## 2018-12-03 NOTE — Discharge Instructions (Addendum)
Your symptoms are consistent with likely viral illness.  We recommend 600 mg ibuprofen every 6 hours for management of fever and body aches.  You may alternate this with Tylenol as needed for persistent symptoms.  Drink plenty of fluids to prevent dehydration.  You do have a coronavirus test pending which should result in 48 hours.  Follow-up on the results of this test.  If you test positive for coronavirus, you should continue quarantine for at least a full 14 days.  Follow-up with a primary care doctor to ensure resolution of symptoms.

## 2018-12-04 LAB — URINE CULTURE: Culture: >=100000 — AB

## 2018-12-05 ENCOUNTER — Telehealth (HOSPITAL_COMMUNITY): Payer: Self-pay | Admitting: Pharmacist

## 2018-12-05 ENCOUNTER — Telehealth (HOSPITAL_COMMUNITY): Payer: Self-pay

## 2018-12-05 ENCOUNTER — Telehealth: Payer: Self-pay | Admitting: *Deleted

## 2018-12-05 NOTE — Progress Notes (Addendum)
ED Antimicrobial Stewardship Positive Culture Follow Up   Christian Horton is an 53 y.o. male who presented to [facility] on (Not on file) with a chief complaint of No chief complaint on file.   Recent Results (from the past 720 hour(s))  Urine culture     Status: Abnormal   Collection Time: 12/03/18  4:32 AM   Specimen: Urine, Random  Result Value Ref Range Status   Specimen Description URINE, RANDOM  Final   Special Requests   Final    NONE Performed at [facility], [address], [place], [place] [address]    Culture >=100,000 COLONIES/mL PROTEUS MIRABILIS (A)  Final   Report Status 12/04/2018 FINAL  Final   Organism ID, Bacteria PROTEUS MIRABILIS (A)  Final      Susceptibility   Proteus mirabilis - MIC*    AMPICILLIN <=2 SENSITIVE Sensitive     CEFAZOLIN <=4 SENSITIVE Sensitive     CEFTRIAXONE <=1 SENSITIVE Sensitive     CIPROFLOXACIN <=0.25 SENSITIVE Sensitive     GENTAMICIN <=1 SENSITIVE Sensitive     IMIPENEM 1 SENSITIVE Sensitive     NITROFURANTOIN 128 RESISTANT Resistant     TRIMETH/SULFA <=20 SENSITIVE Sensitive     AMPICILLIN/SULBACTAM <=2 SENSITIVE Sensitive     PIP/TAZO <=4 SENSITIVE Sensitive     * >=100,000 COLONIES/mL PROTEUS MIRABILIS    []  Treated with , organism resistant to prescribed antimicrobial [x]  Patient discharged originally without antimicrobial agent and treatment is now indicated  New antibiotic prescription: Keflex 500 mg po BID x 7 days  ED Provider: S. L[place]le [place] 12/05/2018, 9:18 AM Clinical Pharmacist Monday - Friday phone -  [phone] Saturday - Sunday phone - [phone]

## 2018-12-05 NOTE — Telephone Encounter (Signed)
Post ED Visit - Positive Culture Follow-up: Successful Patient Follow-Up  Culture assessed and recommendations reviewed by:  []  Elenor Quinones, Pharm.D. []  Heide Guile, Pharm.D., BCPS AQ-ID []  Parks Neptune, Pharm.D., BCPS []  Alycia Rossetti, Pharm.D., BCPS []  [place], [place].D., BCPS, AAHIVP []  Legrand Como, Pharm.D., BCPS, AAHIVP []  Salome Arnt, PharmD, BCPS []  Johnnette Gourd, PharmD, BCPS [x]  Hughes Better, PharmD, BCPS []  Leeroy Cha, PharmD  Positive urine culture  [x]  Patient discharged without antimicrobial prescription and treatment is now indicated []  Organism is resistant to prescribed ED discharge antimicrobial []  Patient with positive blood cultures  Changes discussed with ED provider Franchot Heidelberg, PA New antibiotic prescription Keflex 500mg  PO BID x 7 days Called to [place]  Contacted patient, date 12/05/2018, time [place], [place] 12/05/2018, 10:56 AM

## 2018-12-06 LAB — NOVEL CORONAVIRUS, NAA (HOSP ORDER, SEND-OUT TO REF LAB; TAT 18-24 HRS): SARS-CoV-2, NAA: NOT DETECTED

## 2018-12-10 MED FILL — CEPHALEXIN 500 MG CAPSULE: 500 | 7 days supply | Qty: 14 | Fill #0

## 2018-12-12 ENCOUNTER — Telehealth (HOSPITAL_COMMUNITY): Payer: Self-pay

## 2018-12-25 MED FILL — PROAIR HFA 90 MCG INHALER: 108 (90 BAS | 75 days supply | Qty: 26 | Fill #3

## 2018-12-25 MED FILL — CEPHALEXIN 500 MG CAPSULE: 500 | 7 days supply | Qty: 14 | Fill #0

## 2019-01-06 MED FILL — AMMONIUM LACTATE 12% LOTION: 12 | 25 days supply | Qty: 225 | Fill #0

## 2019-01-13 MED FILL — CLOPIDOGREL 75 MG TABLET: 75 | 30 days supply | Qty: 30 | Fill #0

## 2019-01-13 MED FILL — LOSARTAN POTASSIUM 25 MG TA: 25 | 30 days supply | Qty: 30 | Fill #0

## 2019-01-20 MED FILL — AMMONIUM LACTATE 12% LOTION: 12 | 25 days supply | Qty: 225 | Fill #0

## 2019-02-06 MED FILL — EUCRISA 2% OINTMENT: 2 | 30 days supply | Qty: 60 | Fill #0

## 2019-03-03 MED FILL — TRIAMCINOLONE ACETONIDE 0.1: 0.1 | 7 days supply | Qty: 30 | Fill #0

## 2019-03-24 MED FILL — TRIAMCINOLONE ACETONIDE 0.1: 0.1 | 7 days supply | Qty: 30 | Fill #1

## 2019-03-25 MED FILL — EUCRISA 2% OINTMENT: 2 | 30 days supply | Qty: 60 | Fill #1

## 2019-04-21 MED FILL — TRIAMCINOLONE ACETONIDE 0.1: 0.1 | 7 days supply | Qty: 30 | Fill #2

## 2019-04-23 ENCOUNTER — Other Ambulatory Visit: Payer: Self-pay

## 2019-04-23 ENCOUNTER — Emergency Department (HOSPITAL_COMMUNITY)
Admission: EM | Admit: 2019-04-23 | Discharge: 2019-04-23 | Disposition: A | Discharge status: Home / Self Care | Payer: Medicaid Other | Attending: Emergency Medicine | Admitting: Emergency Medicine

## 2019-04-23 ENCOUNTER — Encounter (HOSPITAL_COMMUNITY): Payer: Self-pay | Admitting: Emergency Medicine

## 2019-04-23 DIAGNOSIS — Z91010 Allergy to peanuts: Secondary | ICD-10-CM | POA: Diagnosis not present

## 2019-04-23 DIAGNOSIS — R109 Unspecified abdominal pain: Secondary | ICD-10-CM | POA: Insufficient documentation

## 2019-04-23 DIAGNOSIS — Z7901 Long term (current) use of anticoagulants: Secondary | ICD-10-CM | POA: Diagnosis not present

## 2019-04-23 DIAGNOSIS — I5021 Acute systolic (congestive) heart failure: Secondary | ICD-10-CM | POA: Diagnosis not present

## 2019-04-23 DIAGNOSIS — R110 Nausea: Secondary | ICD-10-CM | POA: Diagnosis not present

## 2019-04-23 DIAGNOSIS — Z87891 Personal history of nicotine dependence: Secondary | ICD-10-CM | POA: Insufficient documentation

## 2019-04-23 DIAGNOSIS — I110 Hypertensive heart disease with heart failure: Secondary | ICD-10-CM | POA: Diagnosis not present

## 2019-04-23 DIAGNOSIS — Z79899 Other long term (current) drug therapy: Secondary | ICD-10-CM | POA: Insufficient documentation

## 2019-04-23 DIAGNOSIS — R197 Diarrhea, unspecified: Secondary | ICD-10-CM | POA: Insufficient documentation

## 2019-04-23 DIAGNOSIS — I2510 Atherosclerotic heart disease of native coronary artery without angina pectoris: Secondary | ICD-10-CM | POA: Diagnosis not present

## 2019-04-23 LAB — CBC
HCT: 45.5 % (ref 39.0–52.0)
Hemoglobin: 14.8 g/dL (ref 13.0–17.0)
MCH: 25.3 pg — ABNORMAL LOW (ref 26.0–34.0)
MCHC: 32.5 g/dL (ref 30.0–36.0)
MCV: 77.9 fL — ABNORMAL LOW (ref 80.0–100.0)
Platelets: 227 10*3/uL (ref 150–400)
RBC: 5.84 MIL/uL — ABNORMAL HIGH (ref 4.22–5.81)
RDW: 14.8 % (ref 11.5–15.5)
WBC: 3.3 10*3/uL — ABNORMAL LOW (ref 4.0–10.5)
nRBC: 0 % (ref 0.0–0.2)

## 2019-04-23 LAB — COMPREHENSIVE METABOLIC PANEL
ALT: 33 U/L (ref 0–44)
AST: 50 U/L — ABNORMAL HIGH (ref 15–41)
Albumin: 3.6 g/dL (ref 3.5–5.0)
Alkaline Phosphatase: 83 U/L (ref 38–126)
Anion gap: 9 (ref 5–15)
BUN: 8 mg/dL (ref 6–20)
CO2: 26 mmol/L (ref 22–32)
Calcium: 9 mg/dL (ref 8.9–10.3)
Chloride: 106 mmol/L (ref 98–111)
Creatinine, Ser: 1.07 mg/dL (ref 0.61–1.24)
GFR calc Af Amer: >60 mL/min (ref >60)
GFR calc non Af Amer: >60 mL/min (ref >60)
Glucose, Bld: 85 mg/dL (ref 70–99)
Potassium: 3.8 mmol/L (ref 3.5–5.1)
Sodium: 141 mmol/L (ref 135–145)
Total Bilirubin: 0.7 mg/dL (ref 0.3–1.2)
Total Protein: 7 g/dL (ref 6.5–8.1)

## 2019-04-23 LAB — LIPASE, BLOOD: Lipase: 25 U/L (ref 11–51)

## 2019-04-23 MED ORDER — DICYCLOMINE HCL 10 MG PO CAPS
10.0000 mg | ORAL_CAPSULE | Freq: Once | ORAL | Status: AC
  Administered 2019-04-23: 10 mg via ORAL
  Filled 2019-04-23: qty 1

## 2019-04-23 MED ORDER — LOPERAMIDE HCL 2 MG PO CAPS: 2.0000 mg | ORAL_CAPSULE | Freq: Four times a day (QID) | ORAL | 0 refills | Status: AC | PRN

## 2019-04-23 MED ORDER — SODIUM CHLORIDE 0.9% FLUSH: 3.0000 mL | Freq: Once | INTRAVENOUS | Status: DC

## 2019-04-23 MED ORDER — DICYCLOMINE HCL 20 MG PO TABS: 20.0000 mg | ORAL_TABLET | Freq: Three times a day (TID) | ORAL | 0 refills | Status: AC | PRN

## 2019-04-23 NOTE — ED Triage Notes (Signed)
C/o mild nausea and diarrhea that started after drinking a ginger ale this morning that expired 2 months ago.    Denies pain at present.  States he only has pain just prior to having diarrhea episode.

## 2019-04-23 NOTE — ED Notes (Signed)
Pt verbalized understanding of d/c instructions, follow up care, prescriptions and s/s requiring return to ED. Pt had no further questions at this time 

## 2019-04-23 NOTE — ED Notes (Signed)
Pt unable to give urine sample at this time 

## 2019-04-23 NOTE — ED Provider Notes (Signed)
[facility] EMERGENCY DEPARTMENT Provider Note   CSN: [id number] Arrival date & time: 04/23/19  1647     History   Chief Complaint Chief Complaint  Patient presents with  . Diarrhea  . Nausea    HPI Rowan Blaker is a 53 y.o. male.     HPI Patient presents with abdominal pain.  States he had nausea and diarrhea that started after drinking some ginger ale.  States it was severe crampy pain in his abdomen.  Had diarrhea and pain is improved some but still some mild left-sided pain.  No fevers or chills.  No known sick contacts.  No chest pain.  No lightheadedness or dizziness.  States the pain was severe. Past Medical History:  Diagnosis Date  . Allergy   . Asthma   . CAD (coronary artery disease) 08/13/2016  . Congestive heart failure (HCC) 08/08/2016  . Essential hypertension 02/18/2015  . Hilar adenopathy 08/09/2016  . Hyperlipidemia 08/13/2016    Patient Active Problem List   Diagnosis Date Noted  . CAD (coronary artery disease) 08/13/2016  . Constipation 08/13/2016  . Hyperlipidemia 08/13/2016  . Hilar adenopathy 08/09/2016  . Acute systolic CHF (congestive heart failure), NYHA class 3 (HCC)   . Precordial pain   . Dilated cardiomyopathy (HCC)   . Congestive heart failure (HCC) 08/08/2016  . Dyspnea 08/07/2016  . Mild intermittent asthma with exacerbation   . Atypical chest pain 02/18/2015  . Essential hypertension 02/18/2015  . Abnormal EKG 02/18/2015    Past Surgical History:  Procedure Laterality Date  . INTRAVASCULAR PRESSURE WIRE/FFR STUDY N/A 08/10/2016   Procedure: Intravascular Pressure Wire/FFR Study;  Surgeon: Yvonne Kendall, MD;  Location: [facility] INVASIVE CV LAB;  Service: Cardiovascular;  Laterality: N/A;  . LEFT HEART CATH AND CORONARY ANGIOGRAPHY N/A 08/10/2016   Procedure: Left Heart Cath and Coronary Angiography;  Surgeon: Yvonne Kendall, MD;  Location: [facility] INVASIVE CV LAB;  Service: Cardiovascular;  Laterality: N/A;  . NO PAST  SURGERIES          Home Medications    Prior to Admission medications   Medication Sig Start Date End Date Taking? Authorizing Provider  albuterol (PROVENTIL HFA;VENTOLIN HFA) 108 (90 Base) MCG/ACT inhaler Inhale 2 puffs into the lungs every 4 (four) hours as needed for wheezing or shortness of breath (cough, shortness of breath or wheezing.). 04/08/18   Loletta Specter, PA-C  atorvastatin (LIPITOR) 40 MG tablet Take 1 tablet (40 mg total) by mouth daily at 6 PM. 04/08/18   Loletta Specter, PA-C  carvedilol (COREG) 25 MG tablet Take 1 tablet (25 mg total) by mouth 2 (two) times daily. 05/06/18   Loletta Specter, PA-C  cetirizine (ZYRTEC) 10 MG tablet Take 1 tablet (10 mg total) by mouth daily. 08/22/18   Wurst, [place], PA-C  clopidogrel (PLAVIX) 75 MG tablet Take 1 tablet (75 mg total) by mouth daily. 04/08/18   Loletta Specter, PA-C  dicyclomine (BENTYL) 20 MG tablet Take 1 tablet (20 mg total) by mouth 3 (three) times daily as needed for spasms. 04/23/19   Benjiman Core, MD  fluticasone (FLONASE) 50 MCG/ACT nasal spray Place 2 sprays into both nostrils daily. 08/22/18   Wurst, [place], PA-C  ibuprofen (ADVIL) 600 MG tablet Take 1 tablet (600 mg total) by mouth every 6 (six) hours as needed. 11/06/18   Wieters, Hallie C, PA-C  lisinopril (PRINIVIL,ZESTRIL) 40 MG tablet Take 1 tablet (40 mg total) by mouth daily. 05/06/18   Loletta Specter,  PA-C  loperamide (IMODIUM) 2 MG capsule Take 1 capsule (2 mg total) by mouth 4 (four) times daily as needed for diarrhea or loose stools. 04/23/19   Davonna Belling, MD  mometasone-formoterol (DULERA) 200-5 MCG/ACT AERO INHALE 2 PUFFS INTO THE LUNGS 2 TIMES DAILY. Patient taking differently: Inhale 2 puffs into the lungs 2 (two) times a day.  04/08/18   Clent Demark, PA-C  triamcinolone ointment (KENALOG) 0.5 % APPLY 1 APPLICATION TOPICALLY 2 TIMES DAILY. Do not use for longer than 14 consecutive days. Patient not taking: Reported on  12/03/2018 05/06/18   Clent Demark, PA-C    Family History Family History  Problem Relation Age of Onset  . Asthma Mother   . Stroke Father   . Cancer Father   . Asthma Sister   . Obesity Sister     Social History Social History   Tobacco Use  . Smoking status: Former Smoker    Packs/day: 0.50    Years: 17.00    Pack years: 8.50    Types: Cigarettes    Quit date: 1995    Years since quitting: 25.9  . Smokeless tobacco: Never Used  Substance Use Topics  . Alcohol use: No    Alcohol/week: 0.0 standard drinks  . Drug use: Yes    Types: Marijuana     Allergies   Fish allergy, Aspirin, Peanut-containing drug products, Penicillins, and Powder   Review of Systems Review of Systems   Physical Exam Updated Vital Signs BP (!) 162/96 (BP Location: Right Arm)   Pulse 80   Temp 98.3 F (36.8 C) (Oral)   Resp 18   SpO2 99%   Physical Exam   ED Treatments / Results  Labs (all labs ordered are listed, but only abnormal results are displayed) Labs Reviewed  COMPREHENSIVE METABOLIC PANEL - Abnormal; Notable for the following components:      Result Value   AST 50 (*)    All other components within normal limits  CBC - Abnormal; Notable for the following components:   WBC 3.3 (*)    RBC 5.84 (*)    MCV 77.9 (*)    MCH 25.3 (*)    All other components within normal limits  LIPASE, BLOOD  URINALYSIS, ROUTINE W REFLEX MICROSCOPIC    EKG None  Radiology No results found.  Procedures Procedures (including critical care time)  Medications Ordered in ED Medications  dicyclomine (BENTYL) capsule 10 mg (10 mg Oral Given 04/23/19 1843)     Initial Impression / Assessment and Plan / ED Course  I have reviewed the triage vital signs and the nursing notes.  Pertinent labs & imaging results that were available during my care of the patient were reviewed by me and considered in my medical decision making (see chart for details).        Patient presents  with crampy diarrhea.  Both of decreased now.  Feels much better after Bentyl.  Tolerate orals.  Will discharge home.  Benign abdominal exam.  Final Clinical Impressions(s) / ED Diagnoses   Final diagnoses:  Diarrhea, unspecified type    ED Discharge Orders         Ordered    dicyclomine (BENTYL) 20 MG tablet  3 times daily PRN     04/23/19 2006    loperamide (IMODIUM) 2 MG capsule  4 times daily PRN     04/23/19 Jonelle Sports, MD 04/23/19 2007

## 2019-04-24 MED FILL — DICYCLOMINE 20 MG TABLET: 20 | 2 days supply | Qty: 8 | Fill #0

## 2019-05-23 DEATH — deceased

## 2019-07-26 IMAGING — DX DG HIP (WITH OR WITHOUT PELVIS) 2-3V LEFT
3 series · 3 of 3 positions shown · non-contrast
Comparison: None.

CLINICAL DATA: Pain status post fall.

EXAM:
DG HIP (WITH OR WITHOUT PELVIS) 2-3V LEFT

[pelvis ap]
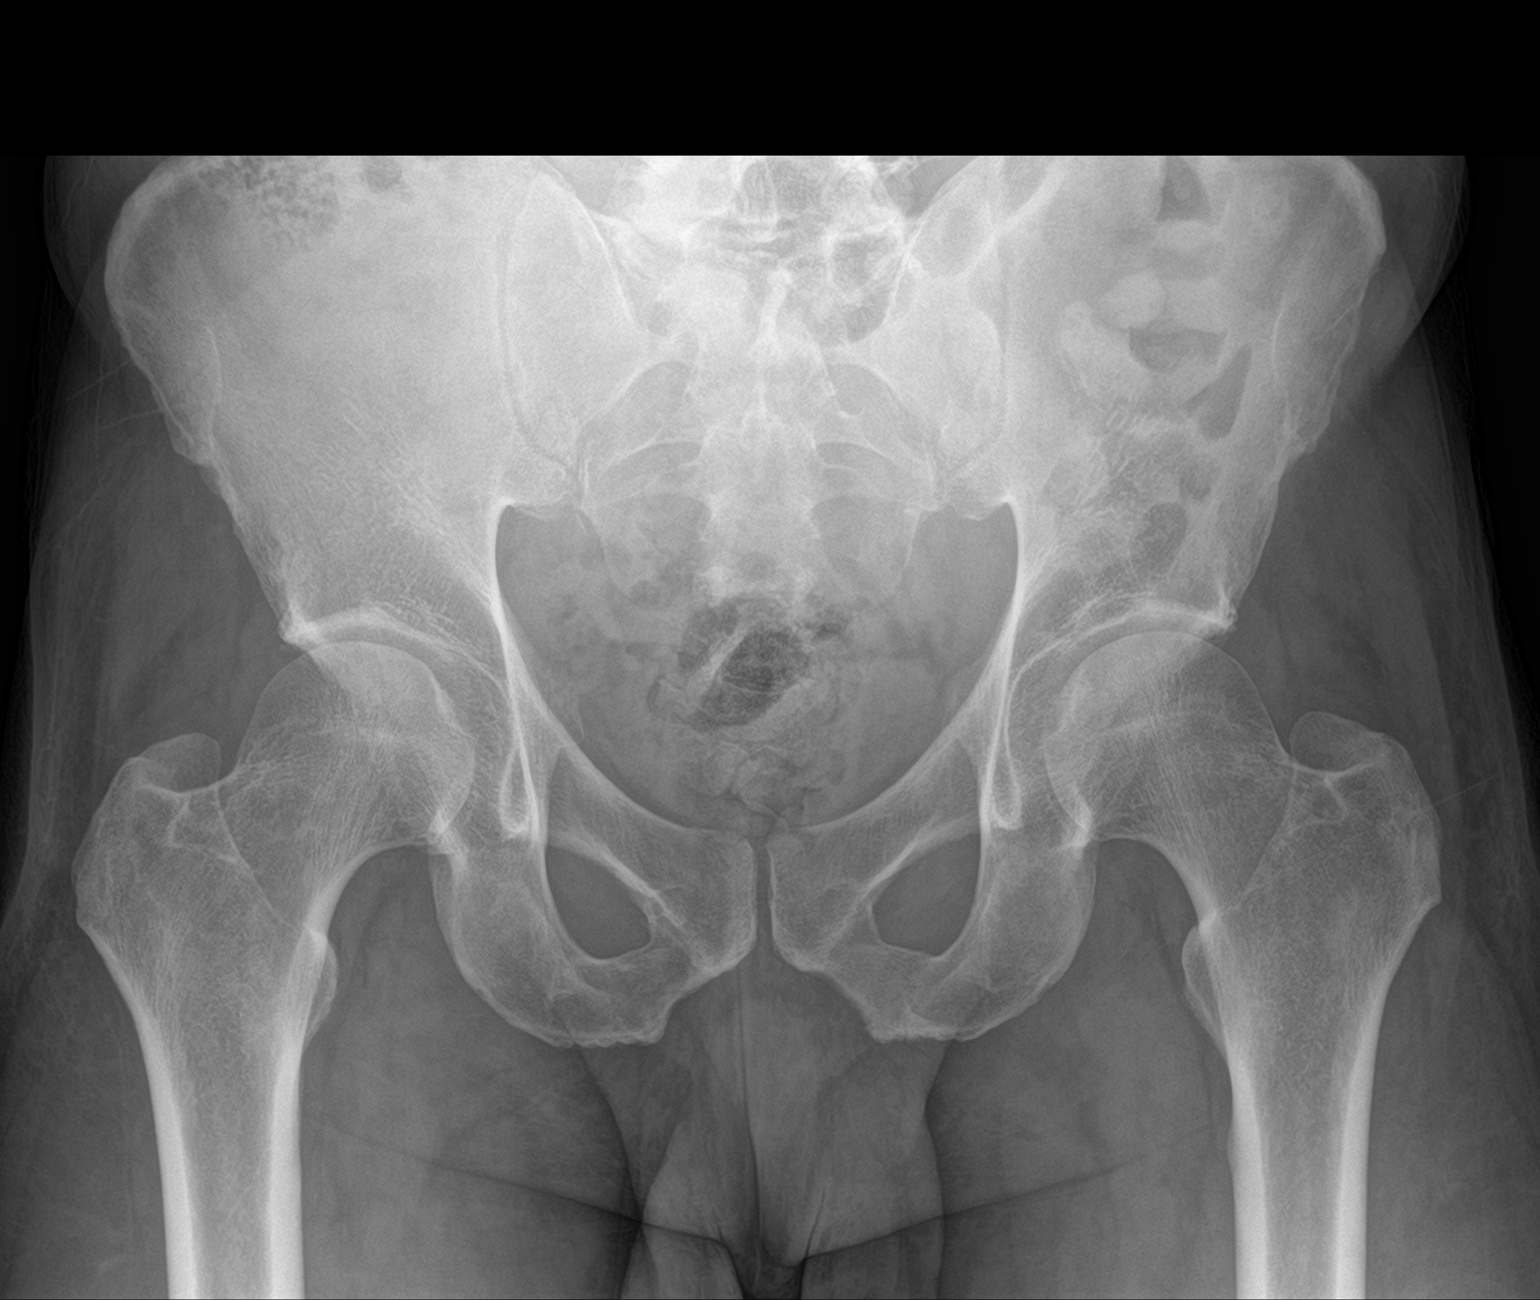

[hip ap]
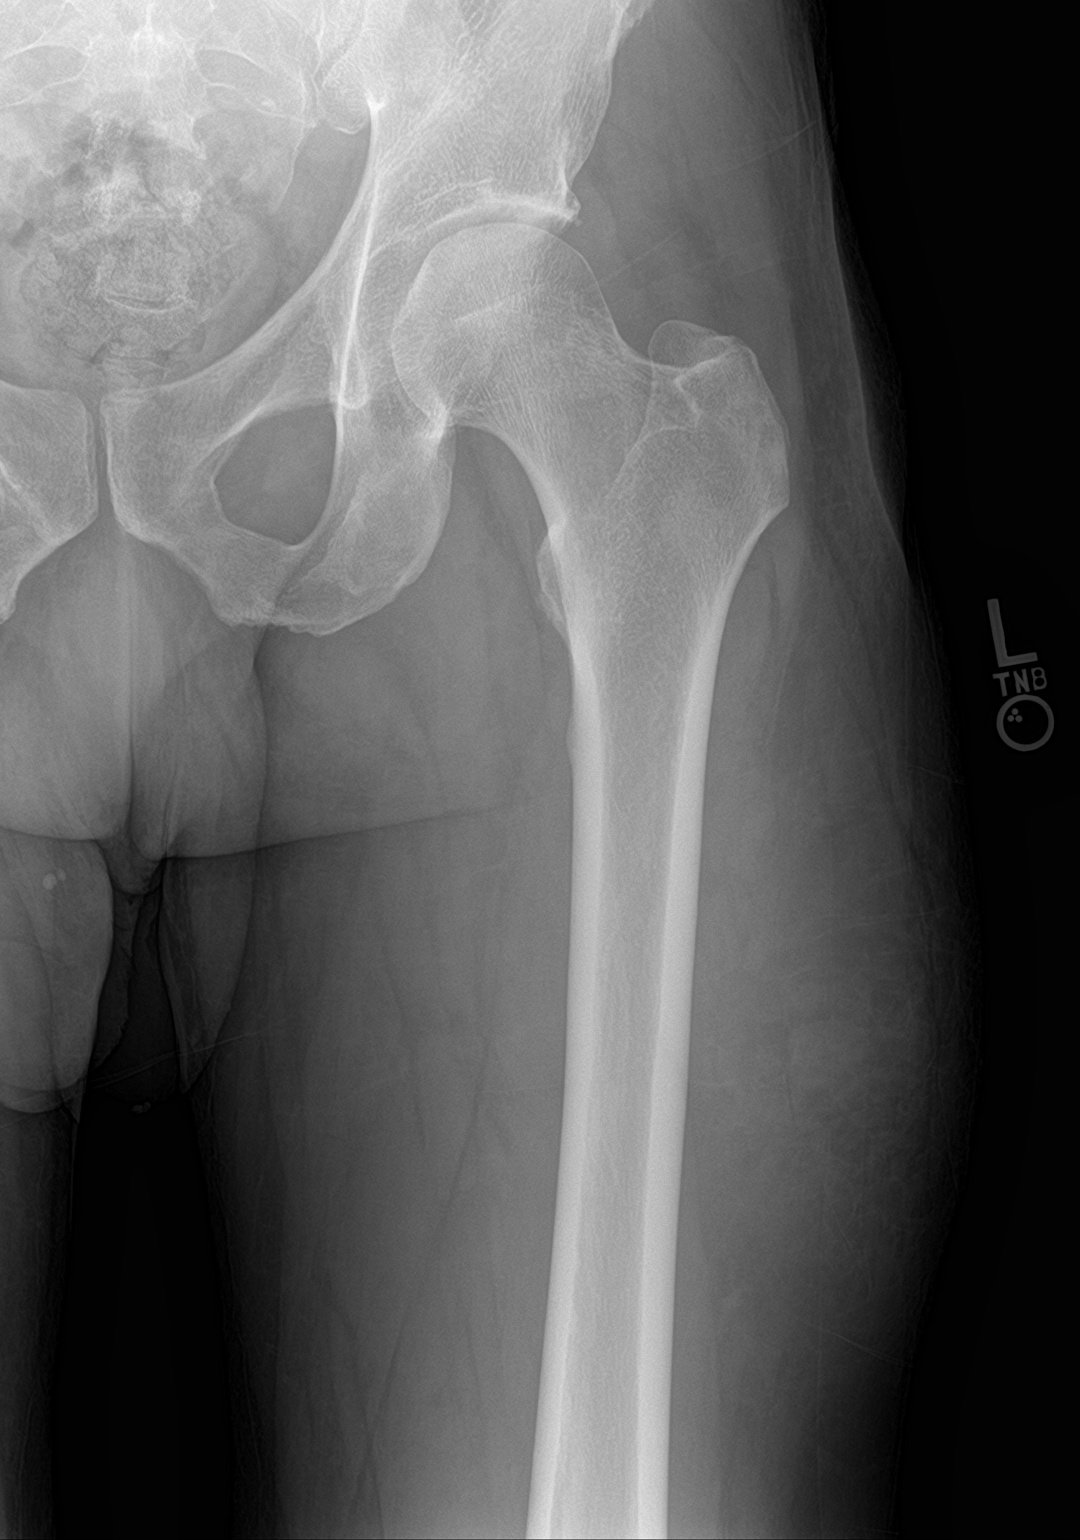

[hip lat]
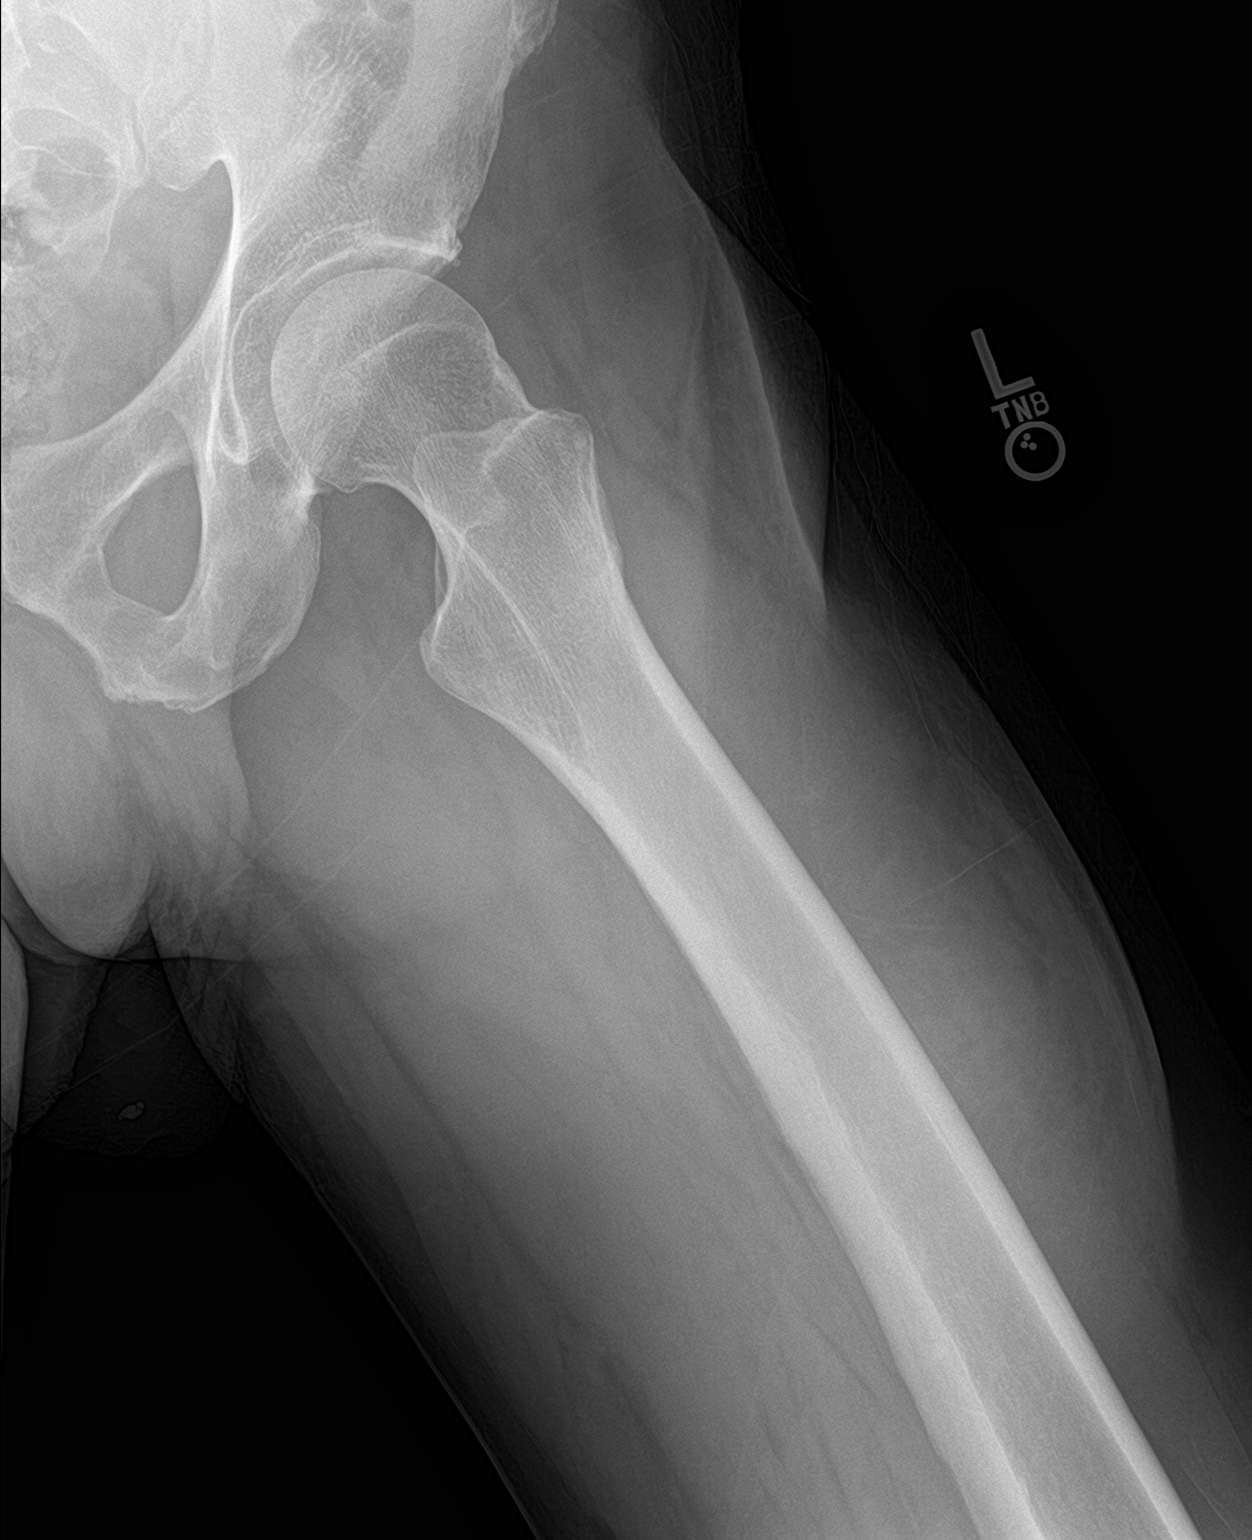

[3 of 3 positions shown; findings below may reference images not displayed]

FINDINGS: There is no evidence of hip fracture or dislocation. There is no
evidence of arthropathy or other focal bone abnormality.
IMPRESSION: Negative.

## 2019-07-26 IMAGING — DX LEFT KNEE - COMPLETE 4+ VIEW
5 series · 5 of 5 positions shown · non-contrast
Comparison: None.

CLINICAL DATA: Pain status post fall

EXAM:
LEFT KNEE - COMPLETE 4+ VIEW

[knee ap]
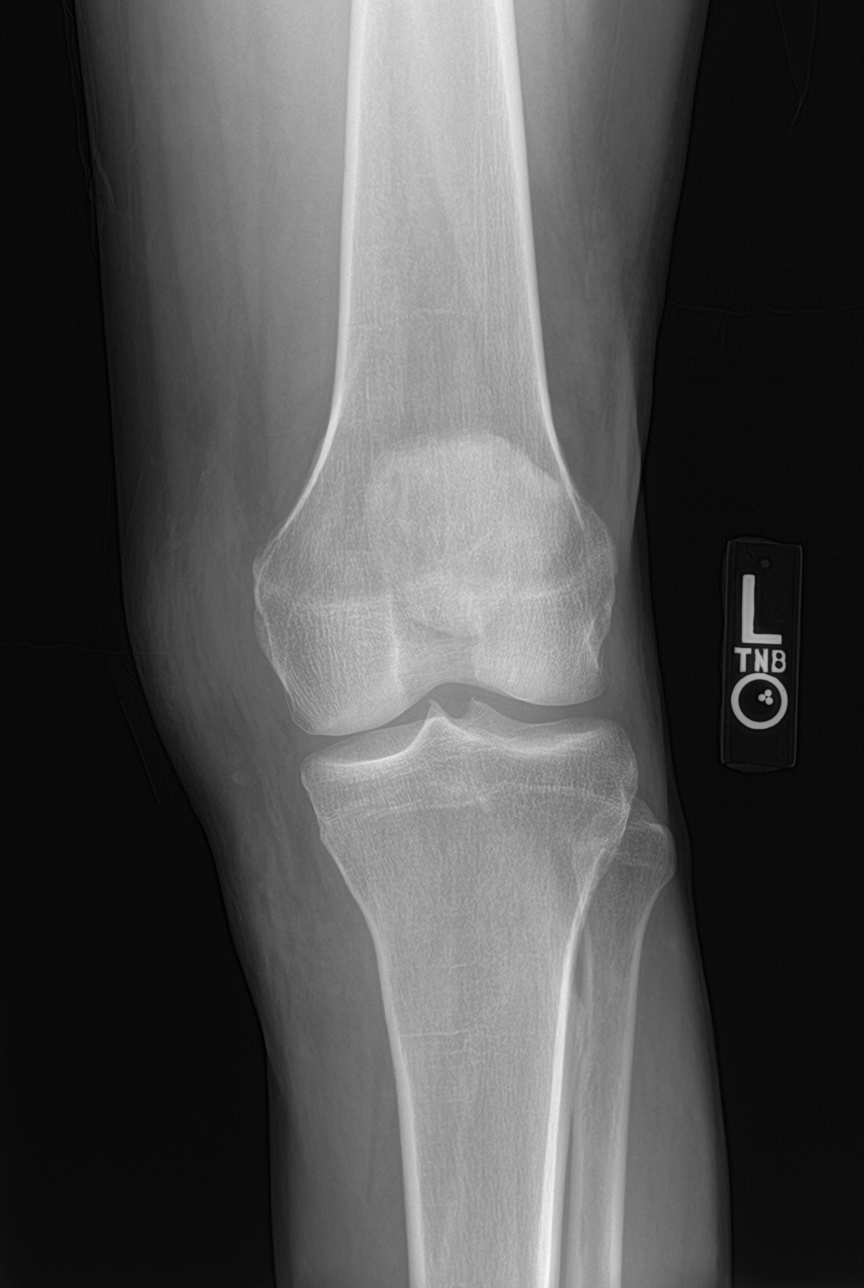

[knee obl (1 of 2)]
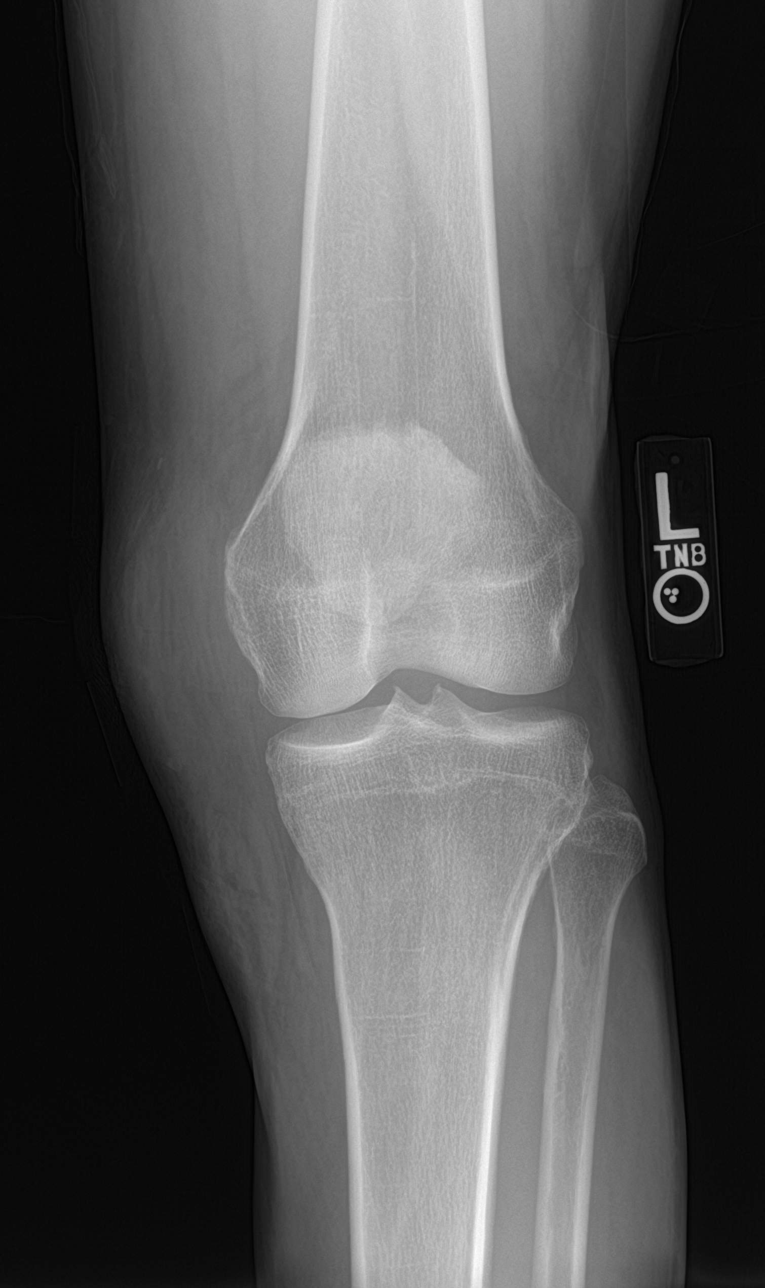

[knee obl (2 of 2)]
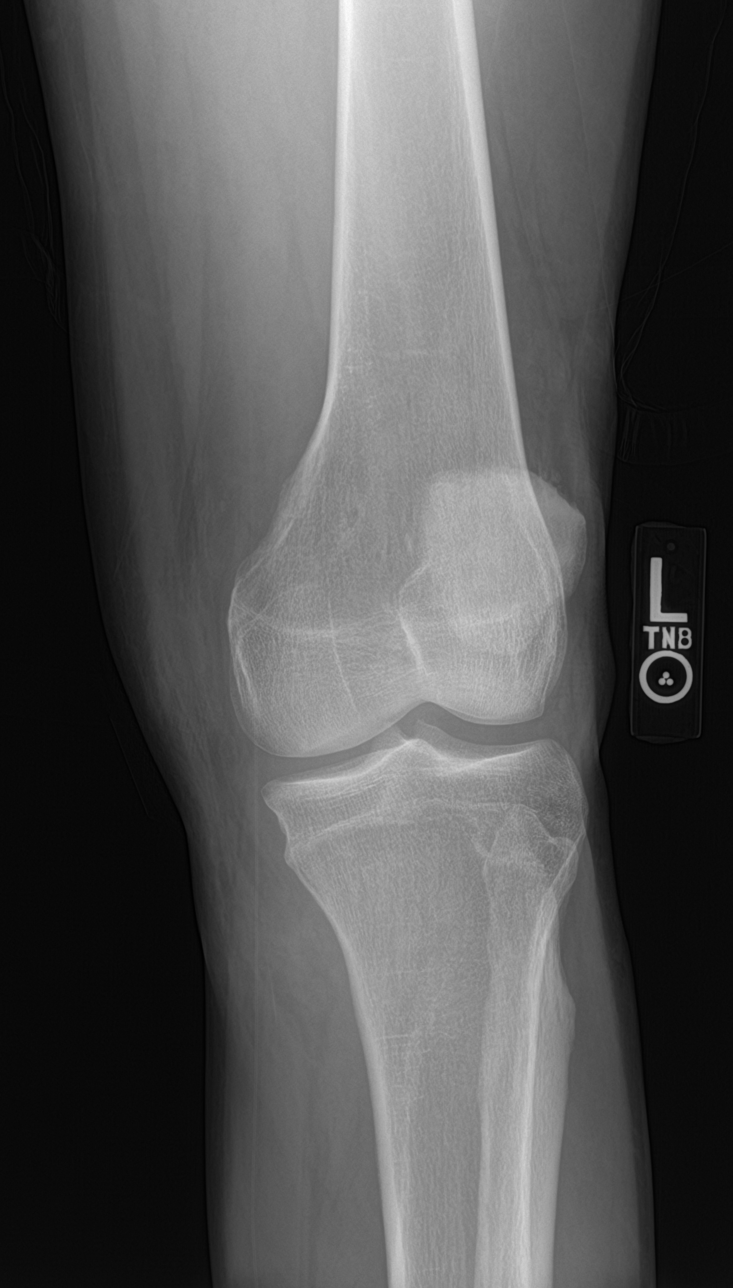

[knee lat]
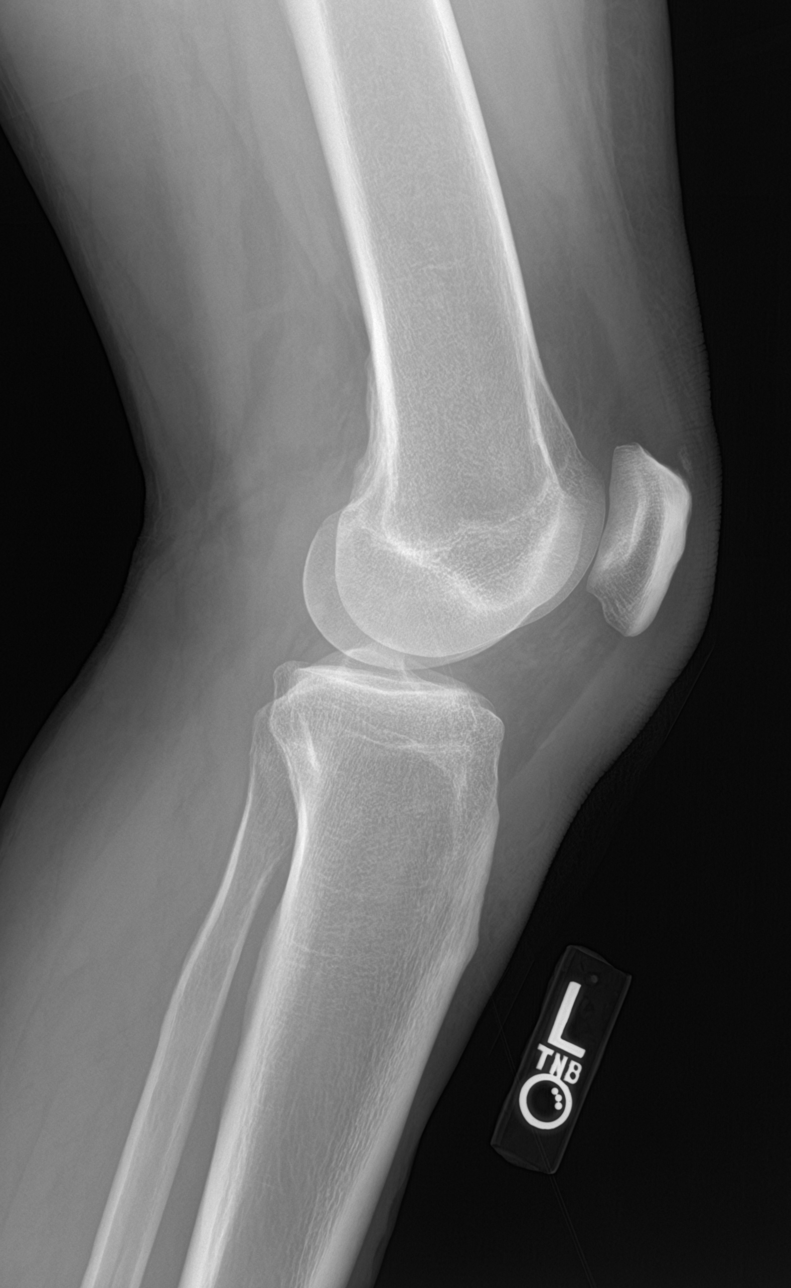

[knee sunrise]
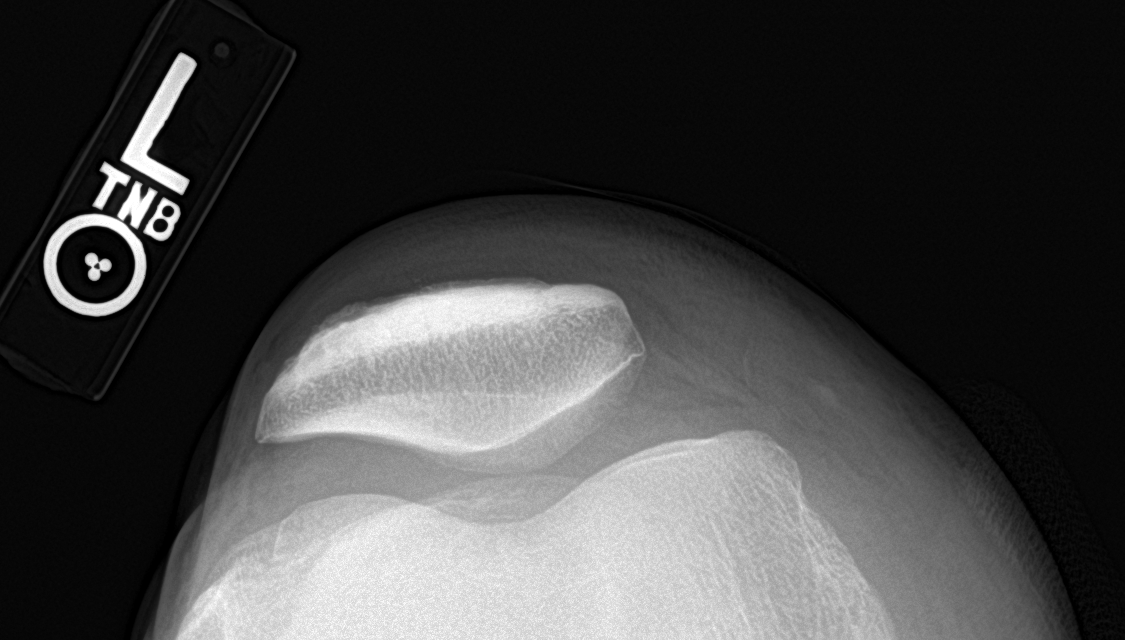

[5 of 5 positions shown; findings below may reference images not displayed]

FINDINGS: There is soft tissue swelling about the knee without evidence of a
displaced fracture or dislocation. There is no significant joint
effusion. The joint spaces are relatively well preserved.
IMPRESSION: 1. No acute osseous abnormality.
2. Soft tissue swelling about the knee.
3. No significant joint effusion.
